# Patient Record
Sex: Female | Born: 1985
Health system: Southern US, Community
[De-identification: ages and names within clinical notes are randomized; demographics above are authoritative.]

## PROBLEM LIST (undated history)

## (undated) ENCOUNTER — Inpatient Hospital Stay (HOSPITAL_COMMUNITY): Payer: Self-pay

## (undated) DIAGNOSIS — O24419 Gestational diabetes mellitus in pregnancy, unspecified control: Secondary | ICD-10-CM

## (undated) DIAGNOSIS — F419 Anxiety disorder, unspecified: Secondary | ICD-10-CM

## (undated) DIAGNOSIS — Z789 Other specified health status: Secondary | ICD-10-CM

## (undated) HISTORY — DX: Anxiety disorder, unspecified: F41.9

## (undated) HISTORY — DX: Other specified health status: Z78.9

## (undated) HISTORY — PX: NO PAST SURGERIES: SHX2092

---

## 2007-06-30 ENCOUNTER — Emergency Department: Payer: Self-pay | Admitting: Emergency Medicine

## 2008-04-11 ENCOUNTER — Other Ambulatory Visit: Admission: RE | Admit: 2008-04-11 | Discharge: 2008-04-11 | Payer: Self-pay | Admitting: Obstetrics and Gynecology

## 2012-01-03 DIAGNOSIS — N946 Dysmenorrhea, unspecified: Secondary | ICD-10-CM | POA: Insufficient documentation

## 2012-01-24 DIAGNOSIS — R102 Pelvic and perineal pain: Secondary | ICD-10-CM | POA: Insufficient documentation

## 2015-11-24 DIAGNOSIS — Z6824 Body mass index (BMI) 24.0-24.9, adult: Secondary | ICD-10-CM | POA: Diagnosis not present

## 2015-11-24 DIAGNOSIS — Z01419 Encounter for gynecological examination (general) (routine) without abnormal findings: Secondary | ICD-10-CM | POA: Diagnosis not present

## 2015-12-24 DIAGNOSIS — L309 Dermatitis, unspecified: Secondary | ICD-10-CM | POA: Diagnosis not present

## 2015-12-24 DIAGNOSIS — L700 Acne vulgaris: Secondary | ICD-10-CM | POA: Diagnosis not present

## 2016-01-14 ENCOUNTER — Encounter: Payer: Self-pay | Admitting: Internal Medicine

## 2016-01-14 ENCOUNTER — Ambulatory Visit (INDEPENDENT_AMBULATORY_CARE_PROVIDER_SITE_OTHER): Payer: 59 | Admitting: Internal Medicine

## 2016-01-14 VITALS — BP 112/72 | HR 72 | Ht 63.0 in | Wt 134.0 lb

## 2016-01-14 DIAGNOSIS — Z8639 Personal history of other endocrine, nutritional and metabolic disease: Secondary | ICD-10-CM

## 2016-01-14 NOTE — Patient Instructions (Signed)
Please come for labs between 8-9 am, fasting, and plan to be here for 1h.  Stop all steroid products until then.  We will schedule a new appt if labs are abnormal.

## 2016-01-14 NOTE — Progress Notes (Signed)
Patient ID: Natalie Stewart, female   DOB: 08/03/1985, 30 y.o.   MRN: [id number]    HPI  Natalie Stewart is a 30 y.o.-year-old female, referred by her ObGyn Dr., Dr. Renaldo FiddlerAdkins, for evaluation for suspicion for adrenal insufficiency. DR. Kirke Corineborah Rotenstein (peds endo) [place], [place].  Pt. has been found to have "borderline" adrenal insufficiency after having bloodwork in her early 2320s. She was only was advised to take Prednisone if she got sick or under stress >> she never filled the Rx.   [place] [facility] [place], she was seen by Dr. Renaldo FiddlerAdkins, who recommended to get evaluated for AI before starting OCPs.   No h/o steroid use in the past. She is now on a Hydrocortisone cream for dry skin  - daily for 1 mo - on eyelids, upper cheek, elbow. No h/o Depo-provera, Megace, po ketoconazole, phenytoin, rifampin, chronic fluconazole use. + h/o autoimmune diseases in pt or family mbs.: PGM - DM1, M uncle - DM1. No excess use of NSAIDs. No h/o generalized infections or HIV. No IVDA. No h/o head injury or severe HA - has a h/o stress HA, or triggered by stress. No h/o malignancy.  Pt mentions: - + dizziness when stands - tries to stay hydrated - stable weight - no increased fatigue - + nausea, acid reflux - no vomiting - no abdominal pain - no mm aches - no palpitations - + syncopal episode x1 - with blood draw - no dark skin discoloration - + regular menses  No h/o hyponatremia or hyperkalemia.  ROS: Constitutional: See history of present illness Eyes: no blurry vision, no xerophthalmia ENT: no sore throat, no nodules palpated in throat, no dysphagia/odynophagia, no hoarseness Cardiovascular: no CP/SOB/palpitations/leg swelling Respiratory: no cough/SOB Gastrointestinal: + Occasional N/no V/D/C, + occasional acid reflux Musculoskeletal: no muscle/joint aches Skin: no rashes Neurological: no tremors/numbness/tingling/dizziness, + headaches Psychiatric: no  depression/anxiety  No past medical history except as mentioned above.  No past surgical history.  Social History   Social History  . Marital status: Single, Will get married next year     Spouse name: N/A  . Number of children: 0   Occupational History  . Cardiac surgery PA    Social History Main Topics  . Smoking status: Never Smoker  . Smokeless tobacco: Never Used  . Alcohol use Wine, twice a week, 1-2 drinks   . Drug use: No    She is not on prescription medications, other than hydrocortisone ointment. She is also on Tums and Advil.  No Known Allergies   She has a family history of type 1 diabetes in maternal uncle and paternal grandmother. Also, family history of HTN in father.  PE: Ht 5\' 3"  (1.6 m)   Wt 134 lb (60.8 kg)   LMP 12/31/2015   BMI 23.74 kg/m  Wt Readings from Last 3 Encounters:  01/14/16 134 lb (60.8 kg)   Constitutional: overweight, in NAD Eyes: PERRLA, EOMI, no exophthalmos ENT: moist mucous membranes, no thyromegaly, no cervical lymphadenopathy Cardiovascular: RRR, No MRG Respiratory: CTA B Gastrointestinal: abdomen soft, NT, ND, BS+ Musculoskeletal: no deformities, strength intact in all 4 Skin: moist, warm, no rashes; no dark discoloration of skin Neurological: no tremor with outstretched hands, DTR normal in all 4  ASSESSMENT: 1. History of mild adrenal insufficiency (AI)  PLAN:  1. History of mild adrenal insufficiency - The patient mentions that she has been tested in the past for adrenal insufficiency in the tests were mildly abnormal(Patient signed a  release of information request so we can obtain her records from her previous endocrinologist), so she was advised to only use steroids in case of illness or stress. She never filled the prescription. She denies possible symptoms of adrenal insufficiency: Weight loss, abdominal pain, back pain, skin darkening. She does have headaches, which are not new. Her menstrual cycles are regular.  She would like to start OCPs, but was told to recheck her adrenal status before starting these. - we discussed that the diagnosis test for AI is a cosyntropin stimulation test. I explained what this consists of. We will plan to check this at 8 am >> will return for this. I advised her not to use any steroid products around the time of the test >> will stop her hydrocortisone cream - if pt turns out to have AI (although my suspicion is low), we will need to check several tests to try to establish the etiology. - we also discussed about proper replacement with Hydrocortisone in case she has AI - a bid dose is likely best >> 10 mg in am and 5 mg in pm - We'll schedule another appointment if labs returned abnormal.  Component     Latest Ref Rng & Units 01/27/2016 01/27/2016 01/27/2016         8:13 AM  8:57 AM  9:28 AM  Cortisol, Plasma     ug/dL 16.110.3 09.617.8 04.521.5  [id number] ACTH     6 - 50 pg/mL <5 (L)     Cortisol levels are all normal, without any signs of adrenal insufficiency. ACTH has returned low, however, this is most likely due to improper handling (sample needs to be placed din ice immediately after collection). In the setting of of a concomitant normal cortisol level and with a normal stimulation test, the low ACTH level is of no consequence.  Carlus Pavlovristina Daphanie Oquendo, MD PhD [facility]

## 2016-01-21 ENCOUNTER — Other Ambulatory Visit: Payer: 59

## 2016-01-27 ENCOUNTER — Other Ambulatory Visit (INDEPENDENT_AMBULATORY_CARE_PROVIDER_SITE_OTHER): Payer: 59

## 2016-01-27 DIAGNOSIS — Z8639 Personal history of other endocrine, nutritional and metabolic disease: Secondary | ICD-10-CM | POA: Diagnosis not present

## 2016-01-27 DIAGNOSIS — E2740 Unspecified adrenocortical insufficiency: Secondary | ICD-10-CM | POA: Diagnosis not present

## 2016-01-27 LAB — CORTISOL
CORTISOL PLASMA: 21.5 ug/dL
Cortisol, Plasma: 10.3 ug/dL
Cortisol, Plasma: 17.8 ug/dL

## 2016-01-27 MED ORDER — COSYNTROPIN NICU IV SYRINGE 0.25 MG/ML (STANDARD DOSE)
0.2500 mg | Freq: Once | INTRAVENOUS | Status: AC
  Administered 2016-01-27: 0.25 mg via INTRAMUSCULAR

## 2016-02-01 LAB — ACTH

## 2016-04-29 MED FILL — LO LOESTRIN FE 1-10 TABLET: 1 MG-10 MCG | 28 days supply | Qty: 28 | Fill #0

## 2016-05-31 MED FILL — LO LOESTRIN FE 1-10 TABLET: 1 MG-10 MCG | 28 days supply | Qty: 28 | Fill #1

## 2016-06-29 MED FILL — LO LOESTRIN FE 1-10 TABLET: 1 MG-10 MCG | 84 days supply | Qty: 84 | Fill #2

## 2016-07-05 ENCOUNTER — Ambulatory Visit
Admission: RE | Admit: 2016-07-05 | Discharge: 2016-07-05 | Disposition: A | Discharge status: Home / Self Care | Payer: 59 | Source: Ambulatory Visit | Attending: Cardiothoracic Surgery | Admitting: Cardiothoracic Surgery

## 2016-07-05 ENCOUNTER — Other Ambulatory Visit: Payer: Self-pay | Admitting: *Deleted

## 2016-07-05 DIAGNOSIS — R0781 Pleurodynia: Secondary | ICD-10-CM | POA: Diagnosis not present

## 2016-07-05 DIAGNOSIS — S299XXA Unspecified injury of thorax, initial encounter: Secondary | ICD-10-CM | POA: Diagnosis not present

## 2016-07-06 MED FILL — DICLOFENAC SODIUM 1% GEL: 1 | 10 days supply | Qty: 100 | Fill #0

## 2016-07-28 DIAGNOSIS — H5213 Myopia, bilateral: Secondary | ICD-10-CM | POA: Diagnosis not present

## 2016-09-21 MED FILL — LO LOESTRIN FE 1-10 TABLET: 1 MG-10 MCG | 84 days supply | Qty: 84 | Fill #3

## 2016-12-05 DIAGNOSIS — Z6824 Body mass index (BMI) 24.0-24.9, adult: Secondary | ICD-10-CM | POA: Diagnosis not present

## 2016-12-05 DIAGNOSIS — Z01419 Encounter for gynecological examination (general) (routine) without abnormal findings: Secondary | ICD-10-CM | POA: Diagnosis not present

## 2016-12-14 MED FILL — LO LOESTRIN FE 1-10 TABLET: 1 MG-10 MCG | 84 days supply | Qty: 84 | Fill #4

## 2017-03-06 MED FILL — LO LOESTRIN FE 1-10 TABLET: 1 MG-10 MCG | 84 days supply | Qty: 84 | Fill #5

## 2017-06-01 MED FILL — LO LOESTRIN FE 1-10 TABLET: 1 MG-10 MCG | 84 days supply | Qty: 84 | Fill #0

## 2017-06-05 ENCOUNTER — Telehealth: Payer: Self-pay

## 2017-06-05 ENCOUNTER — Ambulatory Visit: Payer: 59 | Admitting: Family Medicine

## 2017-06-05 DIAGNOSIS — H5213 Myopia, bilateral: Secondary | ICD-10-CM | POA: Diagnosis not present

## 2017-06-05 NOTE — Telephone Encounter (Signed)
I called pt and stated that I have her scheduled for 4.15.19 @ 3pm and to call if this needs to be changed/thx dmf  Copied from CRM [id number]. Topic: Appointment Scheduling - Prior Auth Required for Appointment >> Jun 05, 2017  9:39 AM Jolayne Hainesaylor, Brittany L wrote: Pt's new pt/est care apt was cancelled today due to Dr Dayton MartesAron having to be out at the last min. Patient wants/needs to be seen sooner than the next available new patient appt which is in June. Please call patient to [place]. She prefers to be seen after 3 if all possible.  [phone]  >> Jun 05, 2017 11:01 AM Marcha SoldersWalker, Monica Kaydin Karbowski wrote: Per Marcelino DusterMichelle, she will contact patient.

## 2017-06-05 NOTE — Telephone Encounter (Signed)
Tried to call pt again/Friday I called and VM was full/today I was able to LMOVM/I advised that I needed to change her appt as Dr. Dayton MartesAron is not able to be here today and even if she doesn't get this message before she comes to her appt I would like to speak with her/I made the ladies at the front aware as I would like to make sure we get her in soon and if she is fasting we can figure out fasting labs for the OV/thx dmf

## 2017-06-19 ENCOUNTER — Ambulatory Visit (INDEPENDENT_AMBULATORY_CARE_PROVIDER_SITE_OTHER): Payer: 59 | Admitting: Family Medicine

## 2017-06-19 ENCOUNTER — Encounter: Payer: Self-pay | Admitting: Family Medicine

## 2017-06-19 DIAGNOSIS — K219 Gastro-esophageal reflux disease without esophagitis: Secondary | ICD-10-CM

## 2017-06-19 MED ORDER — RANITIDINE HCL 150 MG PO TABS: 150.0000 mg | ORAL_TABLET | Freq: Two times a day (BID) | ORAL | 3 refills | Status: DC

## 2017-06-19 MED FILL — raNITIdine HCL 150 MG TABS: 150 | 30 days supply | Qty: 60 | Fill #0

## 2017-06-19 NOTE — Patient Instructions (Addendum)
Great to meet you.   I will call you with your lab results from today and you can view them online.   We are starting ranitidine nightly or twice daily.  Please keep me updated.

## 2017-06-19 NOTE — Assessment & Plan Note (Signed)
Deteriorated. Start xantac. Check for H pylori today. Discussed GERD diet. Call or return to clinic prn if these symptoms worsen or fail to improve as anticipated. The patient indicates understanding of these issues and agrees with the plan.

## 2017-06-19 NOTE — Progress Notes (Signed)
Subjective:   Patient ID: Natalie Stewart, female    DOB: 01/20/1986, 32 y.o.   MRN: [id number]  Natalie Favreessa Kamiya is a pleasant 32 y.o. year old female who presents to clinic today with New Patient (Initial Visit) (Patient is here today to establish care.  She is not currently fasting.  She had her Tdap in August 2017 with work.  She sees Dr. Renaldo FiddlerAdkins for her Paps and was last completed in October results WNL.  ) and Gastroesophageal Reflux (Patient is here today to discuss reflux.  She tried Omeprazole & Zantac for 4 weeks then D/C'ed when became asymptomatic.  Realizes that she may needs to be on something longterm.)  on 06/19/2017  HPI:  She a PA for cardiovascular surgeons.  Intermittent symptoms of reflux since she was a teenager- mostly epigastric pain but can also have a feeling of acid coming up in her throat.  2 months ago, became acutely worse.  Had a severe episode one morning that lasted 8 hours.  Also had some gas.  She tried zantac and omeprazole for 4 weeks and then stopped both a couple of week ago as she was worried about taking anything long term.  Still having intermittent symptoms but not as severe.  Current Outpatient Medications on File Prior to Visit  Medication Sig Dispense Refill  . hydrocortisone cream 0.5 % Apply 1 application topically 2 (two) times daily as needed.     . LO LOESTRIN FE 1 MG-10 MCG / 10 MCG tablet Take 1 tablet by mouth daily.  4   No current facility-administered medications on file prior to visit.     No Known Allergies  No past medical history on file.   No family history on file.  Social History   Socioeconomic History  . Marital status: Single    Spouse name: Not on file  . Number of children: Not on file  . Years of education: Not on file  . Highest education level: Not on file  Occupational History  . Not on file  Social Needs  . Financial resource strain: Not on file  . Food insecurity:    Worry: Not on file    Inability: Not  on file  . Transportation needs:    Medical: Not on file    Non-medical: Not on file  Tobacco Use  . Smoking status: Never Smoker  . Smokeless tobacco: Never Used  Substance and Sexual Activity  . Alcohol use: Not on file  . Drug use: Not on file  . Sexual activity: Not on file  Lifestyle  . Physical activity:    Days per week: Not on file    Minutes per session: Not on file  . Stress: Not on file  Relationships  . Social connections:    Talks on phone: Not on file    Gets together: Not on file    Attends religious service: Not on file    Active member of club or organization: Not on file    Attends meetings of clubs or organizations: Not on file    Relationship status: Not on file  . Intimate partner violence:    Fear of current or ex partner: Not on file    Emotionally abused: Not on file    Physically abused: Not on file    Forced sexual activity: Not on file  Other Topics Concern  . Not on file  Social History Narrative  . Not on file   The PMH, PSH, Social  History, Family History, Medications, and allergies have been reviewed in [facility], and have been updated if relevant.   Review of Systems  Constitutional: Negative.   Gastrointestinal: Positive for abdominal pain. Negative for abdominal distention, anal bleeding, blood in stool, constipation, diarrhea, nausea, rectal pain and vomiting.  All other systems reviewed and are negative.      Objective:    BP 114/64 (BP Location: Left Arm, Patient Position: Sitting, Cuff Size: Normal)   Pulse 62   Temp 99.4 F (37.4 C) (Oral)   Ht 5' 2.5" (1.588 m)   Wt 132 lb (59.9 kg)   SpO2 98%   BMI 23.76 kg/m    Physical Exam   General:  Well-developed,well-nourished,in no acute distress; alert,appropriate and cooperative throughout examination Head:  normocephalic and atraumatic.   Eyes:  vision grossly intact, PERRL Ears:  R ear normal and L ear normal externally, TMs clear bilaterally Nose:  no external deformity.    Mouth:  good dentition.   Neck:  No deformities, masses, or tenderness noted. Lungs:  Normal respiratory effort, chest expands symmetrically. Lungs are clear to auscultation, no crackles or wheezes. Heart:  Normal rate and regular rhythm. S1 and S2 normal without gallop, murmur, click, rub or other extra sounds. Msk:  No deformity or scoliosis noted of thoracic or lumbar spine.   Extremities:  No clubbing, cyanosis, edema, or deformity noted with normal full range of motion of all joints.   Neurologic:  alert & oriented X3 and gait normal.   Skin:  Intact without suspicious lesions or rashes Psych:  Cognition and judgment appear intact. Alert and cooperative with normal attention span and concentration. No apparent delusions, illusions, hallucinations       Assessment & Plan:   Gastroesophageal reflux disease without esophagitis - Plan: H. pylori antibody, IgG No follow-ups on file.

## 2017-06-20 LAB — H. PYLORI ANTIBODY, IGG: H Pylori IgG: NEGATIVE

## 2017-06-21 ENCOUNTER — Telehealth: Payer: Self-pay

## 2017-06-21 DIAGNOSIS — K219 Gastro-esophageal reflux disease without esophagitis: Secondary | ICD-10-CM

## 2017-06-21 DIAGNOSIS — Z13220 Encounter for screening for lipoid disorders: Secondary | ICD-10-CM

## 2017-06-21 NOTE — Telephone Encounter (Signed)
-----   Message from Dianne Dunalia M Aron, MD sent at 06/21/2017 11:38 AM EDT ----- Yes please. Thank you.

## 2017-06-21 NOTE — Telephone Encounter (Signed)
Created future order per TA/thx dmf   Notes recorded by Dianne DunAron, Talia M, MD on 06/21/2017 at 11:38 AM EDT Yes please. Thank you. ------  Notes recorded by Lerry LinerFrederick, Arthi Mcdonald M, CMA on 06/21/2017 at 11:28 AM EDT TA-Would you like for me to future order CBC/Lipid/CMP/TSH/Vit-[facility] Patrone? Plz advise/thx dmf ------  Notes recorded by Dianne DunAron, Talia M, MD on 06/20/2017 at 5:12 PM EDT Yes we absolutely can check those. ------  Notes recorded by Lerry LinerFrederick, Rosaleen Mazer M, CMA on 06/20/2017 at 2:32 PM EDT Pt aware/she is doing well/she wants to know if she needs to get a baseline of cholesterol and all the other labs? Plz advise/thx dmf

## 2017-07-14 ENCOUNTER — Encounter: Payer: Self-pay | Admitting: Family Medicine

## 2017-08-20 DIAGNOSIS — F419 Anxiety disorder, unspecified: Secondary | ICD-10-CM | POA: Diagnosis not present

## 2017-08-21 MED FILL — raNITIdine HCL 150 MG TABS: 150 | 30 days supply | Qty: 60 | Fill #1

## 2017-10-20 MED FILL — raNITIdine HCL 150 MG TABS: 150 | 30 days supply | Qty: 60 | Fill #2

## 2017-12-13 MED FILL — LO LOESTRIN FE 1-10 TABLET: 1 MG-10 MCG | 28 days supply | Qty: 28 | Fill #0

## 2017-12-18 MED FILL — raNITIdine HCL 150 MG TABS: 150 | 30 days supply | Qty: 60 | Fill #3

## 2018-01-10 DIAGNOSIS — Z6824 Body mass index (BMI) 24.0-24.9, adult: Secondary | ICD-10-CM | POA: Diagnosis not present

## 2018-01-10 DIAGNOSIS — Z01419 Encounter for gynecological examination (general) (routine) without abnormal findings: Secondary | ICD-10-CM | POA: Diagnosis not present

## 2018-01-10 DIAGNOSIS — Z113 Encounter for screening for infections with a predominantly sexual mode of transmission: Secondary | ICD-10-CM | POA: Diagnosis not present

## 2018-02-21 MED FILL — LO LOESTRIN FE 1-10 TABLET: 1 MG-10 MCG | 84 days supply | Qty: 84 | Fill #0

## 2018-02-26 ENCOUNTER — Telehealth: Payer: Self-pay

## 2018-02-26 ENCOUNTER — Other Ambulatory Visit: Payer: Self-pay | Admitting: Family Medicine

## 2018-02-26 MED ORDER — FAMOTIDINE 20 MG PO TABS: 20.0000 mg | ORAL_TABLET | Freq: Two times a day (BID) | ORAL | 3 refills | Status: DC

## 2018-02-26 NOTE — Telephone Encounter (Signed)
eRx sent for pepcid 20 mg twice daily.

## 2018-02-26 NOTE — Telephone Encounter (Signed)
Famotidine too?? There was a recent FDA report stating the nizatidine may be recalled too due to NMDA levels, just like ranitidine. We could try cimetidine or she could buy pepcid OTC.  There are no safety concerns about pepcid.  It must be on back order because of the other H2 blockers being recalled.

## 2018-02-26 NOTE — Telephone Encounter (Signed)
TA-[facility] pharmacy sent a fax stating that Ranitidine and Famotidine are on back order/The only other H2 Blockers I see are Nizatidine and Cimetidine? Plz advise/thx dmf

## 2018-02-26 NOTE — Telephone Encounter (Signed)
TA-The Zantac is on back order and pharmacy is asking for a replacement? Plz advise/thx dmf

## 2018-05-18 MED FILL — LO LOESTRIN FE 1-10 TABLET: 1 MG-10 MCG | 84 days supply | Qty: 84 | Fill #1

## 2018-08-13 MED FILL — LO LOESTRIN FE 1-10 TABLET: 1 MG-10 MCG | 84 days supply | Qty: 84 | Fill #2

## 2018-08-25 NOTE — Progress Notes (Signed)
Subjective:   Patient ID: Natalie Stewart, female    DOB: 07/19/1985, 33 y.o.   MRN: [id number]  Natalie Stewart is a pleasant 33 y.o. year old female who presents to clinic today with Annual Exam (Pt screened at vehicle. She is here today for a CPE.  She is not due for a PAP till 10.16.2021 and sees Dr. Zelphia CairoGretchen Adkins.  Immunizations are UTD.)  on 08/27/2018  HPI:  Doing well.  Back at work.  Still walking a lot.  Has GYN- Dr. Renaldo FiddlerAdkins.  Not due for a pap until 12/2019. On Lo loestrin for OCP Current Outpatient Medications on File Prior to Visit  Medication Sig Dispense Refill  . LO LOESTRIN FE 1 MG-10 MCG / 10 MCG tablet Take 1 tablet by mouth daily.  4   No current facility-administered medications on file prior to visit.     No Known Allergies  No past medical history on file.  No family history on file.  Social History   Socioeconomic History  . Marital status: Single    Spouse name: Not on file  . Number of children: Not on file  . Years of education: Not on file  . Highest education level: Not on file  Occupational History  . Not on file  Social Needs  . Financial resource strain: Not on file  . Food insecurity    Worry: Not on file    Inability: Not on file  . Transportation needs    Medical: Not on file    Non-medical: Not on file  Tobacco Use  . Smoking status: Never Smoker  . Smokeless tobacco: Never Used  Substance and Sexual Activity  . Alcohol use: Not on file  . Drug use: Not on file  . Sexual activity: Not on file  Lifestyle  . Physical activity    Days per week: Not on file    Minutes per session: Not on file  . Stress: Not on file  Relationships  . Social Musicianconnections    Talks on phone: Not on file    Gets together: Not on file    Attends religious service: Not on file    Active member of club or organization: Not on file    Attends meetings of clubs or organizations: Not on file    Relationship status: Not on file  . Intimate partner violence    Fear of current or ex partner: Not on file    Emotionally abused: Not on file    Physically abused: Not on file    Forced sexual activity: Not on file  Other Topics Concern  . Not on file  Social History Narrative  . Not on file   The PMH, PSH, Social History, Family History, Medications, and allergies [facility], and have been updated if relevant.   Review of Systems  Constitutional: Negative.   HENT: Negative.   Eyes: Negative.   Respiratory: Negative.   Endocrine: Negative.   Genitourinary: Negative.   Allergic/Immunologic: Positive for environmental allergies.  Neurological: Negative.        Objective:    BP 110/60 (BP Location: Left Arm, Patient Position: Sitting, Cuff Size: Normal)   Pulse (!) 53   Temp 98.9 F (37.2 C) (Oral)   Ht 5' 2.75" (1.594 m)   Wt 134 lb (60.8 kg)   SpO2 99%   BMI 23.93 kg/m    Physical Exam   General:  Well-developed,well-nourished,in no acute distress; alert,appropriate and cooperative throughout examination  Head:  normocephalic and atraumatic.   Eyes:  vision grossly intact, PERRL Ears:  R ear normal and L ear normal externally, TMs clear bilaterally Nose:  no external deformity.   Mouth:  good dentition.   Neck:  No deformities, masses, or tenderness noted. Lungs:  Normal respiratory effort, chest expands symmetrically. Lungs are clear to auscultation, no crackles or wheezes. Heart:  Normal rate and regular rhythm. S1 and S2 normal without gallop, murmur, click, rub or other extra sounds. Abdomen:  Bowel sounds positive,abdomen soft and non-tender without masses, organomegaly or hernias noted. Msk:  No deformity or scoliosis noted of thoracic or lumbar spine.   Extremities:  No clubbing, cyanosis, edema, or deformity noted with normal full range of motion of all joints.   Neurologic:  alert & oriented X3 and gait normal.   Skin:  Intact without suspicious lesions or rashes Cervical Nodes:  No lymphadenopathy  noted Axillary Nodes:  No palpable lymphadenopathy Psych:  Cognition and judgment appear intact. Alert and cooperative with normal attention span and concentration. No apparent delusions, illusions, hallucinations       Assessment & Plan:   Encounter for well woman exam without gynecological exam - Plan: Comprehensive metabolic panel, Hemoglobin A1c, Lipid panel, CANCELED: Lipid panel, CANCELED: Comprehensive metabolic panel, CANCELED: Hemoglobin A1c,  No follow-ups on file.

## 2018-08-27 ENCOUNTER — Other Ambulatory Visit: Payer: Self-pay

## 2018-08-27 ENCOUNTER — Ambulatory Visit (INDEPENDENT_AMBULATORY_CARE_PROVIDER_SITE_OTHER): Payer: 59 | Admitting: Family Medicine

## 2018-08-27 ENCOUNTER — Encounter: Payer: Self-pay | Admitting: Family Medicine

## 2018-08-27 VITALS — BP 110/60 | HR 53 | Temp 98.9°F | Ht 62.75 in | Wt 134.0 lb

## 2018-08-27 DIAGNOSIS — Z0000 Encounter for general adult medical examination without abnormal findings: Secondary | ICD-10-CM | POA: Insufficient documentation

## 2018-08-27 NOTE — Patient Instructions (Signed)
Great to see you. I will call you with your lab results from today and you can view them online.   

## 2018-08-28 ENCOUNTER — Encounter: Payer: Self-pay | Admitting: Family Medicine

## 2018-08-28 LAB — LIPID PANEL
Cholesterol: 153 mg/dL (ref <200)
HDL: 53 mg/dL (ref >=50)
LDL Cholesterol (Calc): 80 mg/dL (calc)
Non-HDL Cholesterol (Calc): 100 mg/dL (calc) (ref <130)
Total CHOL/HDL Ratio: 2.9 (calc) (ref <5.0)
Triglycerides: 107 mg/dL (ref <150)

## 2018-08-28 LAB — COMPREHENSIVE METABOLIC PANEL
AG Ratio: 2.1 (calc) (ref 1.0–2.5)
ALT: 16 U/L (ref 6–29)
AST: 18 U/L (ref 10–30)
Albumin: 4.7 g/dL (ref 3.6–5.1)
Alkaline phosphatase (APISO): 50 U/L (ref 31–125)
BUN/Creatinine Ratio: 17 (calc) (ref 6–22)
BUN: 20 mg/dL (ref 7–25)
CO2: 27 mmol/L (ref 20–32)
Calcium: 9.5 mg/dL (ref 8.6–10.2)
Chloride: 106 mmol/L (ref 98–110)
Creat: 1.15 mg/dL — ABNORMAL HIGH (ref 0.50–1.10)
Globulin: 2.2 g/dL (calc) (ref 1.9–3.7)
Glucose, Bld: 99 mg/dL (ref 65–99)
Potassium: 5.4 mmol/L — ABNORMAL HIGH (ref 3.5–5.3)
Sodium: 141 mmol/L (ref 135–146)
Total Bilirubin: 0.5 mg/dL (ref 0.2–1.2)
Total Protein: 6.9 g/dL (ref 6.1–8.1)

## 2018-08-28 LAB — HEMOGLOBIN A1C
Hgb A1c MFr Bld: 4.8 % of total Hgb (ref <5.7)
Mean Plasma Glucose: 91 (calc)
eAG (mmol/L): 5 (calc)

## 2018-11-13 MED FILL — LO LOESTRIN FE 1-10 TABLET: 1 MG-10 MCG | 84 days supply | Qty: 84 | Fill #3

## 2018-12-05 DIAGNOSIS — H16102 Unspecified superficial keratitis, left eye: Secondary | ICD-10-CM | POA: Diagnosis not present

## 2019-02-04 DIAGNOSIS — Z01419 Encounter for gynecological examination (general) (routine) without abnormal findings: Secondary | ICD-10-CM | POA: Diagnosis not present

## 2019-02-04 DIAGNOSIS — Z6823 Body mass index (BMI) 23.0-23.9, adult: Secondary | ICD-10-CM | POA: Diagnosis not present

## 2019-02-04 DIAGNOSIS — Z3040 Encounter for surveillance of contraceptives, unspecified: Secondary | ICD-10-CM | POA: Diagnosis not present

## 2019-02-04 DIAGNOSIS — Z3041 Encounter for surveillance of contraceptive pills: Secondary | ICD-10-CM | POA: Diagnosis not present

## 2019-02-04 DIAGNOSIS — N760 Acute vaginitis: Secondary | ICD-10-CM | POA: Diagnosis not present

## 2019-02-04 MED FILL — LO LOESTRIN FE 1-10 TABLET: 1 MG-10 MCG | 84 days supply | Qty: 84 | Fill #0

## 2019-03-05 NOTE — Progress Notes (Signed)
  Self Swab Type: Anterior Nasal

## 2019-03-28 ENCOUNTER — Telehealth: Payer: Self-pay | Admitting: Family Medicine

## 2019-03-28 NOTE — Telephone Encounter (Signed)
Tried to call pt about message sent in: Appointment Request From: Jari Favre    With Provider: [name], MD [[facility]]    Preferred Date Range: Any    Preferred Times: Tuesday Afternoon, Wednesday Afternoon, Thursday Afternoon    Reason for visit: Office Visit    Comments:  Chest pain at night    Lvm, need more detail, Because of the comment I did talk to [facility] about it as well and I was going to try and get more info. Im going to route to Dr Dayton Martes as well just in case

## 2019-03-29 NOTE — Telephone Encounter (Signed)
Please call to check on patient. 

## 2019-03-29 NOTE — Telephone Encounter (Signed)
TA-No recurrent chest pain/has VV scheduled/thx dmf

## 2019-04-01 NOTE — Progress Notes (Signed)
Virtual Visit via Video   Due to the COVID-19 pandemic, this visit was completed with telemedicine (audio/video) technology to reduce patient and provider exposure as well as to preserve personal protective equipment.   I connected with Natalie Stewart by a video enabled telemedicine application and verified that I am speaking with the correct person using two identifiers. Location patient: Home Location provider: [facility], Office Persons participating in the virtual visit: Natalie Stewart, [name], MD   I discussed the limitations of evaluation and management by telemedicine and the availability of in person appointments. The patient expressed understanding and agreed to proceed.  Care Team   Patient Care Team: Dianne Dun, MD as PCP - General (Family Medicine)  Subjective:   HPI: Pt is connecting to discuss chest wall pain that started after she was working out on Wednesday. The pain presented that night and next morning only.  Review of Systems  Constitutional: Negative.  Negative for fever and malaise/fatigue.  HENT: Negative.  Negative for congestion and hearing loss.   Eyes: Negative.  Negative for blurred vision, discharge and redness.  Respiratory: Negative for cough and shortness of breath.   Cardiovascular: Positive for chest pain. Negative for palpitations, orthopnea, claudication, leg swelling and PND.  Gastrointestinal: Negative.  Negative for abdominal pain and heartburn.  Genitourinary: Negative for dysuria.  Musculoskeletal: Positive for myalgias. Negative for back pain, falls, joint pain and neck pain.  Skin: Negative.  Negative for rash.  Neurological: Negative for loss of consciousness and headaches.  Endo/Heme/Allergies: Does not bruise/bleed easily.  Psychiatric/Behavioral: Negative for depression.  All other systems reviewed and are negative.    Patient Active Problem List   Diagnosis Date Noted  . Encounter for well woman exam without gynecological  exam 08/27/2018  . GERD (gastroesophageal reflux disease) 06/19/2017    Social History   Tobacco Use  . Smoking status: Never Smoker  . Smokeless tobacco: Never Used  Substance Use Topics  . Alcohol use: Not on file    Current Outpatient Medications:  .  LO LOESTRIN FE 1 MG-10 MCG / 10 MCG tablet, Take 1 tablet by mouth daily., Disp: , Rfl: 4  No Known Allergies  Objective:  There were no vitals taken for this visit.  VITALS: Per patient if applicable, see vitals. GENERAL: Alert, appears well and in no acute distress. HEENT: Atraumatic, conjunctiva clear, no obvious abnormalities on inspection of external nose and ears. NECK: Normal movements of the head and neck. CARDIOPULMONARY: No increased WOB. Speaking in clear sentences. I:E ratio WNL.  MS: Moves all visible extremities without noticeable abnormality. PSYCH: Pleasant and cooperative, well-groomed. Speech normal rate and rhythm. Affect is appropriate. Insight and judgement are appropriate. Attention is focused, linear, and appropriate.  NEURO: CN grossly intact. Oriented as arrived to appointment on time with no prompting. Moves both UE equally.  SKIN: No obvious lesions, wounds, erythema, or cyanosis noted on face or hands.  Depression screen [facility] 2/9 08/27/2018 06/19/2017  Decreased Interest 0 0  Down, Depressed, Hopeless 0 0  PHQ - 2 Score 0 0     . COVID-19 Education: The signs and symptoms of COVID-19 were discussed with the patient and how to seek care for testing if needed. The importance of social distancing was discussed today. . Reviewed expectations re: course of current medical issues. . Discussed self-management of symptoms. . Outlined signs and symptoms indicating need for more acute intervention. . Patient verbalized understanding and all questions were answered. Marland Kitchen Health Maintenance  issues including appropriate healthy diet, exercise, and smoking avoidance were discussed with patient. . See orders for this  visit as documented in the electronic medical record.  Arnette Norris, MD  Records requested if needed. Time spent: 25  minutes, of which >50% was spent in obtaining information about her symptoms, reviewing her previous labs, evaluations, and treatments, counseling her about her condition (please see the discussed topics above), and developing a plan to further investigate it; she had a number of questions which I addressed.   Lab Results  Component Value Date   GLUCOSE 99 08/27/2018   CHOL 153 08/27/2018   TRIG 107 08/27/2018   HDL 53 08/27/2018   LDLCALC 80 08/27/2018   ALT 16 08/27/2018   AST 18 08/27/2018   NA 141 08/27/2018   K 5.4 (H) 08/27/2018   CL 106 08/27/2018   CREATININE 1.15 (H) 08/27/2018   BUN 20 08/27/2018   CO2 27 08/27/2018   HGBA1C 4.8 08/27/2018    No results found for: TSH No results found for: WBC, HGB, HCT, MCV, PLT Lab Results  Component Value Date   NA 141 08/27/2018   K 5.4 (H) 08/27/2018   CO2 27 08/27/2018   GLUCOSE 99 08/27/2018   BUN 20 08/27/2018   CREATININE 1.15 (H) 08/27/2018   BILITOT 0.5 08/27/2018   AST 18 08/27/2018   ALT 16 08/27/2018   PROT 6.9 08/27/2018   CALCIUM 9.5 08/27/2018   Lab Results  Component Value Date   CHOL 153 08/27/2018   Lab Results  Component Value Date   HDL 53 08/27/2018   Lab Results  Component Value Date   LDLCALC 80 08/27/2018   Lab Results  Component Value Date   TRIG 107 08/27/2018   Lab Results  Component Value Date   CHOLHDL 2.9 08/27/2018   Lab Results  Component Value Date   HGBA1C 4.8 08/27/2018       Assessment & Plan:   Problem List Items Addressed This Visit    None      I am having Nicholes Rough maintain her Lo Loestrin Fe.  No orders of the defined types were placed in this encounter.    Arnette Norris, MD

## 2019-04-02 ENCOUNTER — Telehealth (INDEPENDENT_AMBULATORY_CARE_PROVIDER_SITE_OTHER): Payer: 59 | Admitting: Family Medicine

## 2019-04-02 DIAGNOSIS — R079 Chest pain, unspecified: Secondary | ICD-10-CM | POA: Insufficient documentation

## 2019-04-02 NOTE — Assessment & Plan Note (Signed)
Did burpies on Wednesday early in the day.  Was awoken from sleep with sharp left sided pain 10 or so hours later.  Took a deep breath and she heard a bubble pop- once she heard the pop, felt instant relief.  The following day muscle in that area was sore.  Four days later, ran 5 miles without any CP, SOB, LE or dizziness.  Reassuring history and physical.  Not concerning for cardiac source or air embolism.  Exercising without difficulty.  Most likely a sternal pop due to muscle strain.   She is deferring further work up at this time which is reasonable.  Call or send my chart message prn if these symptoms worsen or fail to improve as anticipated.  The patient indicates understanding of these issues and agrees with the plan.

## 2019-05-01 MED FILL — LO LOESTRIN FE 1-10 TABLET: 1 MG-10 MCG | 84 days supply | Qty: 84 | Fill #1

## 2019-07-25 MED FILL — LO LOESTRIN FE 1-10 TABLET: 1 MG-10 MCG | 84 days supply | Qty: 84 | Fill #2

## 2019-10-18 MED FILL — LO LOESTRIN FE 1-10 TABLET: 1 MG-10 MCG | 84 days supply | Qty: 84 | Fill #3

## 2020-01-13 MED FILL — LO LOESTRIN FE 1-10 TABLET: 1 MG-10 MCG | 84 days supply | Qty: 84 | Fill #4

## 2020-01-20 ENCOUNTER — Encounter (HOSPITAL_COMMUNITY): Payer: Self-pay | Admitting: Anesthesiology

## 2020-01-20 ENCOUNTER — Emergency Department (HOSPITAL_COMMUNITY)
Admission: EM | Admit: 2020-01-20 | Discharge: 2020-01-20 | Disposition: A | Discharge status: Home / Self Care | Payer: PRIVATE HEALTH INSURANCE | Attending: Emergency Medicine | Admitting: Emergency Medicine

## 2020-01-20 DIAGNOSIS — R110 Nausea: Secondary | ICD-10-CM | POA: Diagnosis not present

## 2020-01-20 DIAGNOSIS — R001 Bradycardia, unspecified: Secondary | ICD-10-CM | POA: Insufficient documentation

## 2020-01-20 DIAGNOSIS — R55 Syncope and collapse: Secondary | ICD-10-CM | POA: Diagnosis not present

## 2020-01-20 DIAGNOSIS — I959 Hypotension, unspecified: Secondary | ICD-10-CM | POA: Insufficient documentation

## 2020-01-20 LAB — CBC WITH DIFFERENTIAL/PLATELET
Abs Immature Granulocytes: 0.05 10*3/uL (ref 0.00–0.07)
Basophils Absolute: 0 10*3/uL (ref 0.0–0.1)
Basophils Relative: 0 %
Eosinophils Absolute: 0.1 10*3/uL (ref 0.0–0.5)
Eosinophils Relative: 1 %
HCT: 36.4 % (ref 36.0–46.0)
Hemoglobin: 11.9 g/dL — ABNORMAL LOW (ref 12.0–15.0)
Immature Granulocytes: 1 %
Lymphocytes Relative: 17 %
Lymphs Abs: 1.5 10*3/uL (ref 0.7–4.0)
MCH: 30.5 pg (ref 26.0–34.0)
MCHC: 32.7 g/dL (ref 30.0–36.0)
MCV: 93.3 fL (ref 80.0–100.0)
Monocytes Absolute: 0.5 10*3/uL (ref 0.1–1.0)
Monocytes Relative: 6 %
Neutro Abs: 6.7 10*3/uL (ref 1.7–7.7)
Neutrophils Relative %: 75 %
Platelets: 189 10*3/uL (ref 150–400)
RBC: 3.9 MIL/uL (ref 3.87–5.11)
RDW: 12.5 % (ref 11.5–15.5)
WBC: 8.9 10*3/uL (ref 4.0–10.5)
nRBC: 0 % (ref 0.0–0.2)

## 2020-01-20 LAB — BASIC METABOLIC PANEL
Anion gap: 13 (ref 5–15)
BUN: 17 mg/dL (ref 6–20)
CO2: 19 mmol/L — ABNORMAL LOW (ref 22–32)
Calcium: 8.3 mg/dL — ABNORMAL LOW (ref 8.9–10.3)
Chloride: 106 mmol/L (ref 98–111)
Creatinine, Ser: 0.95 mg/dL (ref 0.44–1.00)
GFR, Estimated: >60 mL/min (ref >60)
Glucose, Bld: 98 mg/dL (ref 70–99)
Potassium: 4 mmol/L (ref 3.5–5.1)
Sodium: 138 mmol/L (ref 135–145)

## 2020-01-20 LAB — I-STAT BETA HCG BLOOD, ED (MC, WL, AP ONLY): I-stat hCG, quantitative: <5 m[IU]/mL (ref <5)

## 2020-01-20 NOTE — ED Notes (Signed)
DC instructions reviewed with pt. Pt verbalized understanding.  Pt DC 

## 2020-01-20 NOTE — Discharge Instructions (Signed)
Return to ER for any worsening or concerning symptoms. Recheck with your doctor as needed.

## 2020-01-20 NOTE — Progress Notes (Signed)
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  Patient was helped to the floor without trauma to her head, and then transferred to stretcher in the PACU for further care.  She states she ate breakfast early this morning, with no PO intake following.  Patient denies any other medical history.  On arrival to PACU, patient VS obtained and showed bradycardia to 40s and hypotension.  A PIV was obtained by a CRNA, IV fluids begun, and 4mg  Zofran given IV for complaint of nausea.  Hypotension improved with IV fluid administration, nausea resolved, and HR returned to patient's baseline in 50s.  Patient initially declined transfer to ED, but after discussion with hospital administration and [facility]), patient agreed to ED transfer for further care and evaluation.  Patient transferred to ED by PACU staff and report given to bedside nurse.  [facility], MD Anesthesiology

## 2020-01-20 NOTE — ED Provider Notes (Signed)
[facility] EMERGENCY DEPARTMENT Provider Note   CSN: [id number] Arrival date & time: 01/20/20  1351     History Chief Complaint  Patient presents with  . Loss of Consciousness    Natalie Stewart is a 34 y.o. female.  34 year old female presents for evaluation after syncopal episode. Patient was in the OR at the hospital today when she started to feel weak, nauseous, dizzy. Patient was told that she passed out but did not fall, was assisted to the ground. Patient was initially hypotensive and bradycardic, CBG normal, given 1 LNS and monitored for about an hour before she was send down to the ER to be evaluated/cleared to return to work. Patient reports history of vasovagal syncope- once while donating blood and a second time 43 years age when she was in school. Patient is on OCP, does not suspect she is pregnant. Reports working out 2-3 times/week before work, had her breakfast and normal workout this morning prior to coming to work. Patient feels well at this time, no complaints.         No past medical history on file.  Patient Active Problem List   Diagnosis Date Noted  . Chest pain at rest 04/02/2019  . Encounter for well woman exam without gynecological exam 08/27/2018  . GERD (gastroesophageal reflux disease) 06/19/2017    OB History   No obstetric history on file.     No family history on file.  Social History   Tobacco Use  . Smoking status: Never Smoker  . Smokeless tobacco: Never Used  Substance Use Topics  . Alcohol use: Not on file  . Drug use: Not on file    Home Medications Prior to Admission medications   Medication Sig Start Date End Date Taking? Authorizing Provider  LO LOESTRIN FE 1 MG-10 MCG / 10 MCG tablet Take 1 tablet by mouth daily. 06/01/17   [provider]  Multiple Vitamins-Minerals (MULTIVITAMIN WITH MINERALS) tablet Take 1 tablet by mouth daily.    [provider]    Allergies    Patient has no known  allergies.  Review of Systems   Review of Systems  Constitutional: Negative for chills and fever.  Respiratory: Negative for shortness of breath.   Cardiovascular: Negative for chest pain.  Gastrointestinal: Positive for nausea. Negative for abdominal pain.  Musculoskeletal: Negative for arthralgias and myalgias.  Skin: Negative for wound.  Allergic/Immunologic: Negative for immunocompromised state.  Neurological: Positive for syncope, weakness and light-headedness.  Psychiatric/Behavioral: Negative for confusion.  All other systems reviewed and are negative.   Physical Exam Updated Vital Signs BP 115/74   Pulse 64   Resp 17   SpO2 100%   Physical Exam Vitals and nursing note reviewed.  Constitutional:      General: She is not in acute distress.    Appearance: She is well-developed. She is not diaphoretic.  HENT:     Head: Normocephalic and atraumatic.     Mouth/Throat:     Mouth: Mucous membranes are moist.  Eyes:     Pupils: Pupils are equal, round, and reactive to light.  Cardiovascular:     Rate and Rhythm: Normal rate and regular rhythm.     Pulses: Normal pulses.     Heart sounds: Normal heart sounds.  Pulmonary:     Effort: Pulmonary effort is normal.     Breath sounds: Normal breath sounds.  Abdominal:     Palpations: Abdomen is soft.     Tenderness:  There is no abdominal tenderness.  Musculoskeletal:     Cervical back: Neck supple.  Skin:    General: Skin is warm and dry.     Findings: No erythema or rash.  Neurological:     Mental Status: She is alert and oriented to person, place, and time.     Sensory: No sensory deficit.     Motor: No weakness.  Psychiatric:        Behavior: Behavior normal.     ED Results / Procedures / Treatments   Labs (all labs ordered are listed, but only abnormal results are displayed) Labs Reviewed  BASIC METABOLIC PANEL - Abnormal; Notable for the following components:      Result Value   CO2 19 (*)    Calcium 8.3  (*)    All other components within normal limits  CBC WITH DIFFERENTIAL/PLATELET - Abnormal; Notable for the following components:   Hemoglobin 11.9 (*)    All other components within normal limits  I-STAT BETA HCG BLOOD, ED ([facility], [facility], [facility] ONLY)    EKG EKG Interpretation  Date/Time:  Monday January 20 2020 15:00:58 EST Ventricular Rate:  60 PR Interval:    QRS Duration: 101 QT Interval:  422 QTC Calculation: 422 R Axis:   62 Text Interpretation: Sinus rhythm Low voltage, precordial leads Confirmed by Benjiman Core [id number]) on 01/20/2020 3:04:42 PM   Radiology No results found.  Procedures Procedures (including critical care time)  Medications Ordered in ED Medications - No data to display  ED Course  I have reviewed the triage vital signs and the nursing notes.  Pertinent labs & imaging results that were available during my care of the patient were reviewed by me and considered in my medical decision making (see chart for details).  Clinical Course as of Jan 19 1625  Mon Jan 20, 2020  5742 34 year old female with report of syncope today while in the ER, reports feeling unwell prior to event, no injury, was assisted to the ground. Patient was evaluated and given IV fluids in the PACU, presents to the ER for clearance.  Patient is well appearing on exam, vitals reassuring, not orthostatic. Labs including CBC and BMP without significant changes, hcg negative. Advised to return to the ER for any concerning symptoms, otherwise recheck with PCP.    [LM]    Clinical Course User Index [LM] Alden Hipp   MDM Rules/Calculators/A&P                          Final Clinical Impression(s) / ED Diagnoses Final diagnoses:  Syncope, unspecified syncope type    Rx / DC Orders ED Discharge Orders    None       Jeannie Fend, PA-C 01/20/20 1626    Benjiman Core, MD 01/21/20 0700

## 2020-01-20 NOTE — ED Triage Notes (Signed)
Pt arrived from [facility] OR where she had a syncopal event while working Pt states a few minutes prior to passing out she started to feel nauseous and had blurry vision.   Staff with pt were able to lower her to the ground, start an IV and give 1L NS.  Pt now alert and oriented, states that she is feeling better, c/o fatigue

## 2020-01-21 LAB — CBG MONITORING, ED: Glucose-Capillary: 101 mg/dL — ABNORMAL HIGH (ref 70–99)

## 2020-01-28 ENCOUNTER — Encounter: Payer: Self-pay | Admitting: Medical-Surgical

## 2020-01-28 ENCOUNTER — Ambulatory Visit (INDEPENDENT_AMBULATORY_CARE_PROVIDER_SITE_OTHER): Payer: 59 | Admitting: Medical-Surgical

## 2020-01-28 ENCOUNTER — Other Ambulatory Visit: Payer: Self-pay | Admitting: Medical-Surgical

## 2020-01-28 VITALS — BP 115/72 | HR 61 | Temp 98.7°F | Ht 62.25 in | Wt 132.1 lb

## 2020-01-28 DIAGNOSIS — Z1159 Encounter for screening for other viral diseases: Secondary | ICD-10-CM | POA: Diagnosis not present

## 2020-01-28 DIAGNOSIS — R55 Syncope and collapse: Secondary | ICD-10-CM

## 2020-01-28 DIAGNOSIS — F418 Other specified anxiety disorders: Secondary | ICD-10-CM

## 2020-01-28 DIAGNOSIS — Z7689 Persons encountering health services in other specified circumstances: Secondary | ICD-10-CM | POA: Diagnosis not present

## 2020-01-28 DIAGNOSIS — Z3041 Encounter for surveillance of contraceptive pills: Secondary | ICD-10-CM | POA: Diagnosis not present

## 2020-01-28 MED ORDER — LO LOESTRIN FE 1 MG-10 MCG / 10 MCG PO TABS: 1.0000 | ORAL_TABLET | Freq: Every day | ORAL | 1 refills | Status: DC

## 2020-01-28 NOTE — Patient Instructions (Addendum)
Consider [employer] for counseling  [email or web address]

## 2020-01-28 NOTE — Progress Notes (Signed)
New Patient Office Visit  Subjective:  Patient ID: Natalie Stewart, female    DOB: 06-21-1985  Age: 34 y.o. MRN: [id number]  CC:  Chief Complaint  Patient presents with  . Establish Care    HPI Natalie Stewart presents to establish care. 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  Scrubbed in at work last week, experienced what was thought to be a vasovagal episode. Was evaluated in the ED and given fluids. No abnormal found. Unfortunately, now is having fear of a repeat episode. Has scrubbed in a few times since her episode and done well but she is also limiting her time that she stays scrubbed in.    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 Wake up in the middle of the night with a sharp pain in her chest that was like a sternal chest wall pain. Only lasts a couple of minutes. Usually happens after a workout or in times of high anxiety (holidays, etc.). Feels the frequency is less than it used to be. Symptoms are manageable overall without intervention.  Past Medical History:  Diagnosis Date  . No pertinent past medical history     Past Surgical History:  Procedure Laterality Date  . NO PAST SURGERIES      Family History  Problem Relation Age of Onset  . Gallbladder disease Mother   . Breast cancer Maternal Grandmother   . Bladder Cancer Paternal Grandmother   . Dementia Paternal Grandmother   . Arthritis Paternal Grandmother   . Heart failure Paternal Grandfather   . Atrial fibrillation Paternal Grandfather    Social History   Socioeconomic History  . Marital status: Single    Spouse name: Not on file  . Number of children: Not on file  . Years of education: Not on file  . Highest education level: Not on file  Occupational History  . Not on file  Tobacco Use  . Smoking status: Never Smoker  . Smokeless tobacco: Never Used  Vaping Use  . Vaping Use: Never used  Substance and Sexual Activity  . Alcohol use: Yes    Alcohol/week: 1.0 standard drink    Types:  1 Standard drinks or equivalent per week  . Drug use: Never  . Sexual activity: Yes    Partners: Male    Birth control/protection: Pill  Other Topics Concern  . Not on file  Social History Narrative  . Not on file   Social Determinants of Health   Financial Resource Strain:   . Difficulty of Paying Living Expenses: Not on file  Food Insecurity:   . Worried About Programme researcher, broadcasting/film/video in the Last Year: Not on file  . Ran Out of Food in the Last Year: Not on file  Transportation Needs:   . Lack of Transportation (Medical): Not on file  . Lack of Transportation (Non-Medical): Not on file  Physical Activity:   . Days of Exercise per Week: Not on file  . Minutes of Exercise per Session: Not on file  Stress:   . Feeling of Stress : Not on file  Social Connections:   . Frequency of Communication with Friends and Family: Not on file  . Frequency of Social Gatherings with Friends and Family: Not on file  . Attends Religious Services: Not on file  . Active Member of Clubs or Organizations: Not on file  . Attends Banker Meetings: Not on file  . Marital Status: Not on file  Intimate Partner Violence:   .  Fear of Current or Ex-Partner: Not on file  . Emotionally Abused: Not on file  . Physically Abused: Not on file  . Sexually Abused: Not on file    ROS Review of Systems  Constitutional: Negative for chills, fatigue, fever and unexpected weight change.  Respiratory: Negative for cough, shortness of breath and wheezing.   Cardiovascular: Positive for chest pain (see HPI). Negative for palpitations and leg swelling.  Neurological: Positive for syncope (see HPI) and headaches (intermittent, mild, responds to Tylenol). Negative for dizziness and light-headedness.  Psychiatric/Behavioral: Negative for dysphoric mood and sleep disturbance. The patient is nervous/anxious (situational).    Objective:   Today's Vitals: BP 115/72   Pulse 61   Temp 98.7 F (37.1 C) (Oral)    Ht 5' 2.25" (1.581 m)   Wt 132 lb 1.6 oz (59.9 kg)   SpO2 99%   BMI 23.97 kg/m   Physical Exam Vitals reviewed.  Constitutional:      General: She is not in acute distress.    Appearance: Normal appearance.  HENT:     Head: Normocephalic and atraumatic.  Cardiovascular:     Rate and Rhythm: Normal rate and regular rhythm.     Pulses: Normal pulses.     Heart sounds: Normal heart sounds. No murmur heard.  No friction rub. No gallop.   Pulmonary:     Effort: Pulmonary effort is normal. No respiratory distress.     Breath sounds: Normal breath sounds. No wheezing.  Skin:    General: Skin is warm and dry.  Neurological:     Mental Status: She is alert and oriented to person, place, and time.  Psychiatric:        Mood and Affect: Mood normal.        Behavior: Behavior normal.        Thought Content: Thought content normal.        Judgment: Judgment normal.    Assessment & Plan:   1. Encounter to establish care Reviewed available information and dicussed health care concerns with patient. Recntly had a full panel of blood work done at her ED visit. Up to date on preventative care.   2. Vasovagal syncope Recommend working to stay well hydrated and try to avoid long periods without food. Discussed measures that might be helpful to get her back to her normal job performance.  3. Situational anxiety Anxiety revolves around the possibility of a second vasovagal syncope episode while in the OR. Unfortunately, medication is not going to be possible due to the setting. Recommend looking into counseling option. Discussed [employer]'s partnership with [employer] to provide 24/7 access to counselors for free.   4. Surveillance for birth control, oral contraceptives She will be followed for birth control by her OB/GYN but can't get in to see her before the first of the year. Birth control due for refill, recent negative pregnancy test. Refilled for 3 months to allow for her appointment  with OB/GYN.   5. Need for hepatitis C screening test Discussed screening recommendations. She is agreeable so we will order today for collection with next blood work.  - Hepatitis C antibody  Outpatient Encounter Medications as of 01/28/2020  Medication Sig  . LO LOESTRIN FE 1 MG-10 MCG / 10 MCG tablet Take 1 tablet by mouth daily.  . Multiple Vitamins-Minerals (MULTIVITAMIN WITH MINERALS) tablet Take 1 tablet by mouth daily.  . [DISCONTINUED] LO LOESTRIN FE 1 MG-10 MCG / 10 MCG tablet Take 1 tablet by mouth  daily.   No facility-administered encounter medications on file as of 01/28/2020.   Follow-up: Return if symptoms worsen or fail to improve.   Thayer Ohm, DNP, APRN, FNP-BC [facility]

## 2020-02-06 DIAGNOSIS — Z20822 Contact with and (suspected) exposure to covid-19: Secondary | ICD-10-CM | POA: Diagnosis not present

## 2020-03-21 DIAGNOSIS — Z20822 Contact with and (suspected) exposure to covid-19: Secondary | ICD-10-CM | POA: Diagnosis not present

## 2020-04-21 MED FILL — LO LOESTRIN FE 1-10 TABLET: 1 MG-10 MCG | 84 days supply | Qty: 84 | Fill #0

## 2020-06-09 DIAGNOSIS — Z6823 Body mass index (BMI) 23.0-23.9, adult: Secondary | ICD-10-CM | POA: Diagnosis not present

## 2020-06-09 DIAGNOSIS — Z01419 Encounter for gynecological examination (general) (routine) without abnormal findings: Secondary | ICD-10-CM | POA: Diagnosis not present

## 2020-08-25 ENCOUNTER — Encounter: Payer: Self-pay | Admitting: Medical-Surgical

## 2020-08-25 DIAGNOSIS — R000 Tachycardia, unspecified: Secondary | ICD-10-CM

## 2020-08-25 DIAGNOSIS — H5213 Myopia, bilateral: Secondary | ICD-10-CM | POA: Diagnosis not present

## 2020-09-23 ENCOUNTER — Emergency Department (HOSPITAL_BASED_OUTPATIENT_CLINIC_OR_DEPARTMENT_OTHER): Payer: 59 | Admitting: Radiology

## 2020-09-23 ENCOUNTER — Encounter (HOSPITAL_BASED_OUTPATIENT_CLINIC_OR_DEPARTMENT_OTHER): Payer: Self-pay | Admitting: *Deleted

## 2020-09-23 ENCOUNTER — Other Ambulatory Visit: Payer: Self-pay

## 2020-09-23 ENCOUNTER — Emergency Department (HOSPITAL_BASED_OUTPATIENT_CLINIC_OR_DEPARTMENT_OTHER)
Admission: EM | Admit: 2020-09-23 | Discharge: 2020-09-23 | Disposition: A | Discharge status: Home / Self Care | Payer: 59 | Attending: Emergency Medicine | Admitting: Emergency Medicine

## 2020-09-23 ENCOUNTER — Emergency Department (HOSPITAL_BASED_OUTPATIENT_CLINIC_OR_DEPARTMENT_OTHER): Payer: 59

## 2020-09-23 DIAGNOSIS — R0602 Shortness of breath: Secondary | ICD-10-CM | POA: Diagnosis not present

## 2020-09-23 DIAGNOSIS — R0789 Other chest pain: Secondary | ICD-10-CM | POA: Diagnosis not present

## 2020-09-23 DIAGNOSIS — R079 Chest pain, unspecified: Secondary | ICD-10-CM | POA: Diagnosis not present

## 2020-09-23 DIAGNOSIS — O26891 Other specified pregnancy related conditions, first trimester: Secondary | ICD-10-CM | POA: Diagnosis not present

## 2020-09-23 DIAGNOSIS — O99891 Other specified diseases and conditions complicating pregnancy: Secondary | ICD-10-CM | POA: Diagnosis not present

## 2020-09-23 DIAGNOSIS — Z3A01 Less than 8 weeks gestation of pregnancy: Secondary | ICD-10-CM | POA: Diagnosis not present

## 2020-09-23 DIAGNOSIS — Z3A00 Weeks of gestation of pregnancy not specified: Secondary | ICD-10-CM | POA: Insufficient documentation

## 2020-09-23 DIAGNOSIS — I82403 Acute embolism and thrombosis of unspecified deep veins of lower extremity, bilateral: Secondary | ICD-10-CM | POA: Diagnosis not present

## 2020-09-23 LAB — COMPREHENSIVE METABOLIC PANEL
ALT: 14 U/L (ref 0–44)
AST: 19 U/L (ref 15–41)
Albumin: 4.7 g/dL (ref 3.5–5.0)
Alkaline Phosphatase: 57 U/L (ref 38–126)
Anion gap: 10 (ref 5–15)
BUN: 12 mg/dL (ref 6–20)
CO2: 24 mmol/L (ref 22–32)
Calcium: 8.8 mg/dL — ABNORMAL LOW (ref 8.9–10.3)
Chloride: 103 mmol/L (ref 98–111)
Creatinine, Ser: 0.82 mg/dL (ref 0.44–1.00)
GFR, Estimated: >60 mL/min (ref >60)
Glucose, Bld: 103 mg/dL — ABNORMAL HIGH (ref 70–99)
Potassium: 3.8 mmol/L (ref 3.5–5.1)
Sodium: 137 mmol/L (ref 135–145)
Total Bilirubin: 0.8 mg/dL (ref 0.3–1.2)
Total Protein: 7.3 g/dL (ref 6.5–8.1)

## 2020-09-23 LAB — CBC WITH DIFFERENTIAL/PLATELET
Abs Immature Granulocytes: 0.03 10*3/uL (ref 0.00–0.07)
Basophils Absolute: 0 10*3/uL (ref 0.0–0.1)
Basophils Relative: 0 %
Eosinophils Absolute: 0.1 10*3/uL (ref 0.0–0.5)
Eosinophils Relative: 1 %
HCT: 38.2 % (ref 36.0–46.0)
Hemoglobin: 13.4 g/dL (ref 12.0–15.0)
Immature Granulocytes: 0 %
Lymphocytes Relative: 19 %
Lymphs Abs: 1.7 10*3/uL (ref 0.7–4.0)
MCH: 30.8 pg (ref 26.0–34.0)
MCHC: 35.1 g/dL (ref 30.0–36.0)
MCV: 87.8 fL (ref 80.0–100.0)
Monocytes Absolute: 0.5 10*3/uL (ref 0.1–1.0)
Monocytes Relative: 6 %
Neutro Abs: 6.8 10*3/uL (ref 1.7–7.7)
Neutrophils Relative %: 74 %
Platelets: 219 10*3/uL (ref 150–400)
RBC: 4.35 MIL/uL (ref 3.87–5.11)
RDW: 12.8 % (ref 11.5–15.5)
WBC: 9.2 10*3/uL (ref 4.0–10.5)
nRBC: 0 % (ref 0.0–0.2)

## 2020-09-23 LAB — BRAIN NATRIURETIC PEPTIDE: B Natriuretic Peptide: 12.3 pg/mL (ref 0.0–100.0)

## 2020-09-23 LAB — TROPONIN I (HIGH SENSITIVITY): Troponin I (High Sensitivity): <2 ng/L (ref <18)

## 2020-09-23 LAB — D-DIMER, QUANTITATIVE: D-Dimer, Quant: 0.35 ug/mL-FEU (ref 0.00–0.50)

## 2020-09-23 NOTE — ED Provider Notes (Signed)
[facility] EMERGENCY DEPT Provider Note   CSN: [id number] Arrival date & time: 09/23/20  1214     History Chief Complaint  Patient presents with   Chest Pain    Natalie Stewart is a 35 y.o. female.  Natalie Stewart is [redacted] weeks pregnant.  She presents with left-sided chest pain radiating to her left neck and even into her back at times.  It has been intermittent for several days, and it seems to be worse with exertion today.  No history of pulmonary embolism or DVT.  No family history of heart disease.  She has follow-up with cardiology tomorrow  The history is provided by the patient.  Chest Pain Pain location:  L chest Pain quality: pressure and sharp   Pain radiates to:  Neck Pain severity:  Moderate Onset quality:  Sudden Duration:  3 days Timing:  Intermittent Progression:  Waxing and waning Chronicity:  New Context comment:  No known cause Relieved by:  Nothing Worsened by:  Exertion Ineffective treatments:  Rest Associated symptoms: shortness of breath   Associated symptoms: no abdominal pain, no back pain, no cough, no diaphoresis, no fever, no lower extremity edema, no nausea, no numbness, no palpitations, no syncope, no vomiting and no weakness       Past Medical History:  Diagnosis Date   No pertinent past medical history     Patient Active Problem List   Diagnosis Date Noted   Vasovagal syncope 01/28/2020   Chest pain at rest 04/02/2019   Encounter for well woman exam without gynecological exam 08/27/2018   GERD (gastroesophageal reflux disease) 06/19/2017    Past Surgical History:  Procedure Laterality Date   NO PAST SURGERIES       OB History   No obstetric history on file.     Family History  Problem Relation Age of Onset   Gallbladder disease Mother    Breast cancer Maternal Grandmother    Bladder Cancer Paternal Grandmother    Dementia Paternal Grandmother    Arthritis Paternal Grandmother    Heart failure Paternal Grandfather     Atrial fibrillation Paternal Grandfather     Social History   Tobacco Use   Smoking status: Never   Smokeless tobacco: Never  Vaping Use   Vaping Use: Never used  Substance Use Topics   Alcohol use: Yes    Alcohol/week: 1.0 standard drink    Types: 1 Standard drinks or equivalent per week   Drug use: Never    Home Medications Prior to Admission medications   Medication Sig Start Date End Date Taking? Authorizing Provider  LO LOESTRIN FE 1 MG-10 MCG / 10 MCG tablet TAKE 1 TABLET BY MOUTH DAILY. 01/28/20 01/27/21  Christen Butter, NP  Multiple Vitamins-Minerals (MULTIVITAMIN WITH MINERALS) tablet Take 1 tablet by mouth daily.    [provider]    Allergies    Patient has no allergy information on record.  Review of Systems   Review of Systems  Constitutional:  Negative for chills, diaphoresis and fever.  HENT:  Negative for ear pain and sore throat.   Eyes:  Negative for pain and visual disturbance.  Respiratory:  Positive for shortness of breath. Negative for cough.   Cardiovascular:  Positive for chest pain. Negative for palpitations and syncope.  Gastrointestinal:  Negative for abdominal pain, nausea and vomiting.  Genitourinary:  Negative for dysuria and hematuria.  Musculoskeletal:  Negative for arthralgias and back pain.  Skin:  Negative for color change and rash.  Neurological:  Negative for seizures, syncope, weakness and numbness.  All other systems reviewed and are negative.  Physical Exam Updated Vital Signs BP 112/76 (BP Location: Left Arm)   Pulse 65   Temp 98.3 F (36.8 C) (Oral)   Resp 18   Ht 5\' 3"  (1.6 m)   Wt 59 kg   SpO2 100%   BMI 23.03 kg/m   Physical Exam Vitals and nursing note reviewed.  Constitutional:      Appearance: She is well-developed.  HENT:     Head: Normocephalic and atraumatic.  Cardiovascular:     Rate and Rhythm: Normal rate and regular rhythm.     Heart sounds: Normal heart sounds.  Pulmonary:     Effort:  Pulmonary effort is normal. No tachypnea.     Breath sounds: Normal breath sounds.  Musculoskeletal:     Right lower leg: No edema.     Left lower leg: No edema.  Skin:    General: Skin is warm and dry.  Neurological:     General: No focal deficit present.     Mental Status: She is alert and oriented to person, place, and time.  Psychiatric:        Mood and Affect: Mood normal.        Behavior: Behavior normal.    ED Results / Procedures / Treatments   Labs (all labs ordered are listed, but only abnormal results are displayed) Labs Reviewed  COMPREHENSIVE METABOLIC PANEL - Abnormal; Notable for the following components:      Result Value   Glucose, Bld 103 (*)    Calcium 8.8 (*)    All other components within normal limits  CBC WITH DIFFERENTIAL/PLATELET  D-DIMER, QUANTITATIVE  BRAIN NATRIURETIC PEPTIDE  TROPONIN I (HIGH SENSITIVITY)  TROPONIN I (HIGH SENSITIVITY)    EKG EKG Interpretation  Date/Time:  Wednesday September 23 2020 12:26:15 EDT Ventricular Rate:  86 PR Interval:  124 QRS Duration: 88 QT Interval:  366 QTC Calculation: 437 R Axis:   79 Text Interpretation: Normal sinus rhythm Possible Anterior infarct , age undetermined Abnormal ECG normal axis No acute ischemia Confirmed by [id number] ([id number]) on 09/23/2020 4:21:48 PM  Radiology DG Chest 2 View  Result Date: 09/23/2020 CLINICAL DATA:  Left chest pain EXAM: CHEST - 2 VIEW COMPARISON:  07/05/2016 FINDINGS: The heart size and mediastinal contours are within normal limits. Both lungs are clear. The visualized skeletal structures are unremarkable. IMPRESSION: No active cardiopulmonary disease. Electronically Signed   By: 09/04/2016 M.D.   On: 09/23/2020 17:59   09/25/2020 Venous Img Lower Bilateral  Result Date: 09/23/2020 CLINICAL DATA:  Deep venous thrombosis. Chest pain for 2 days. Five weeks pregnant. EXAM: BILATERAL LOWER EXTREMITY VENOUS DOPPLER ULTRASOUND TECHNIQUE: Gray-scale sonography with compression, as  well as color and duplex ultrasound, were performed to evaluate the deep venous system(s) from the level of the common femoral vein through the popliteal and proximal calf veins. COMPARISON:  None. FINDINGS: RIGHTLOWER EXTREMITY VENOUSS Normal compressibility of the common femoral, superficial femoral, and popliteal veins, as well as the visualized calf veins. Visualized portions of profunda femoral vein and great saphenous vein unremarkable. No filling defects to suggest DVT on grayscale or color Doppler imaging. Doppler waveforms show normal direction of venous flow, normal respiratory plasticity and response to augmentation. LEFT LOWER EXTREMITY VENOUS Normal compressibility of the common femoral, superficial femoral, and popliteal veins, as well as the visualized calf veins. Visualized portions of profunda femoral vein and great saphenous  vein unremarkable. No filling defects to suggest DVT on grayscale or color Doppler imaging. Doppler waveforms show normal direction of venous flow, normal respiratory plasticity and response to augmentation. OTHER None. Limitations: none IMPRESSION: No deep venous thrombosis of bilateral lower extremities. Electronically Signed   By: Tish Frederickson M.D.   On: 09/23/2020 17:39    Procedures Procedures   Medications Ordered in ED Medications - No data to display  ED Course  I have reviewed the triage vital signs and the nursing notes.  Pertinent labs & imaging results that were available during my care of the patient were reviewed by me and considered in my medical decision making (see chart for details).    MDM Rules/Calculators/A&P                           HEART score: 2  Natalie Stewart presents with chest pain.  She is [redacted] weeks pregnant.  She was evaluated for evidence of ACS, PE.  No evidence of pneumonia.  I did order DVT scans initially because I was anticipating a possibly elevated D-dimer level due to her pregnancy.  Fortunately, her D-dimer was within  normal limits which excludes PE from the differential.  No evidence of congestive heart failure/cardiomyopathy.  She will be discharged home with recommendations for close follow-up, and she has cardiology follow-up tomorrow.  Pregnancy is uncomplicated, and she denies abdominal pain.  Imaging was not performed for her pregnancy. Final Clinical Impression(s) / ED Diagnoses Final diagnoses:  Chest pain, unspecified type  [redacted] weeks gestation of pregnancy    Rx / DC Orders ED Discharge Orders     None        Koleen Distance, MD 09/23/20 Rickey Primus

## 2020-09-23 NOTE — ED Triage Notes (Signed)
Presents with left ant chest pain, radiates to left of neck, radiation to back occurred last PM, While at work became even more short of breath, states she felt like her HR would be fast than slow. First trimester , approx [redacted] weeks pregnant.

## 2020-09-24 ENCOUNTER — Ambulatory Visit (INDEPENDENT_AMBULATORY_CARE_PROVIDER_SITE_OTHER): Payer: 59

## 2020-09-24 ENCOUNTER — Encounter: Payer: Self-pay | Admitting: Internal Medicine

## 2020-09-24 ENCOUNTER — Ambulatory Visit (INDEPENDENT_AMBULATORY_CARE_PROVIDER_SITE_OTHER): Payer: 59 | Admitting: Internal Medicine

## 2020-09-24 VITALS — BP 104/72 | HR 66 | Ht 63.0 in | Wt 130.0 lb

## 2020-09-24 DIAGNOSIS — R079 Chest pain, unspecified: Secondary | ICD-10-CM

## 2020-09-24 DIAGNOSIS — R55 Syncope and collapse: Secondary | ICD-10-CM | POA: Diagnosis not present

## 2020-09-24 DIAGNOSIS — R002 Palpitations: Secondary | ICD-10-CM | POA: Insufficient documentation

## 2020-09-24 DIAGNOSIS — R0602 Shortness of breath: Secondary | ICD-10-CM

## 2020-09-24 NOTE — Progress Notes (Signed)
New Outpatient Visit Date: 09/24/2020  Referring Provider: Christen Butter, NP [address] [place],  [place] [address]  Chief Complaint: Palpitations and chest pain  HPI:  Natalie Stewart is a 34 y.o. female who is being seen today for the evaluation of chest pain and tachycardia at the request of Natalie Stewart. She has no significant past medical history.  Beginning in March, Natalie Stewart began to notice episodic elevated heart rates, sometimes going up to 140 bpm, without clear precipitant.  She would monitor her heart rate on her smart watch, indicating that it appeared to be sinus tachycardia.  Episodes initially happened every few weeks but have become more frequent.  In retrospect, she notes discontinuation of OCP shortly before symptoms began.  She notes associated lightheadedness and shortness of breath.  There were no obvious precipitants for her palpitations; she thought that elevated heart rates may be related to stress from her upcoming wedding.  Three days ago, Natalie Stewart began to experience burning and squeezing in her left upper chest radiating to the back.  She notes some tenderness in this area.  Pain also seem to improve when she would lean forward and was worsened with deep inspiration.  She discovered that she was pregnant that day and thought maybe that could be contributing to her discomfort.  She stopped drinking caffeinated coffee, in case that precipitated her chest pain.  She also ice the area.  The pain would come and go throughout the day but became more persistent yesterday.  It was associated with palpitations, lightheadedness, dizziness, and vision changes.  A coworker checked her blood pressure and noted her systolic pressure to be 130 mmHg.  She was also tachycardic though an exact pulse rate was not measured.  She proceeded to the [facility] for further evaluation.  Work-up was unrevealing, including normal EKG and negative at HS-TnI x 1.  BNP and D-dimer  were also negative.  The symptoms persisted through most of her ED visit but were no longer present this morning.  Natalie Stewart notes sharp chest pains associated with heavy lifting about a year ago, which resolved after she stopped doing [employer].  She has experienced occasional syncopal episodes with blood draws.  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  She reports feeling dizzy and clammy for a few moments and then recalls waking up on the floor with anesthesia at her side.  ED work-up was unremarkable.  --------------------------------------------------------------------------------------------------  Cardiovascular History & Procedures: Cardiovascular Problems: Chest pain Palpitations Syncope  Risk Factors: None  Cath/PCI: None  CV Surgery: None  EP Procedures and Devices: None  Non-Invasive Evaluation(s): None  Recent CV Pertinent Labs: Lab Results  Component Value Date   CHOL 153 08/27/2018   HDL 53 08/27/2018   LDLCALC 80 08/27/2018   TRIG 107 08/27/2018   CHOLHDL 2.9 08/27/2018   BNP 12.3 09/23/2020   K 3.8 09/23/2020   BUN 12 09/23/2020   CREATININE 0.82 09/23/2020   CREATININE 1.15 (H) 08/27/2018    --------------------------------------------------------------------------------------------------  Past Medical History:  Diagnosis Date   No pertinent past medical history     Past Surgical History:  Procedure Laterality Date   NO PAST SURGERIES      Current Meds  Medication Sig   Prenatal Vit-Fe Fumarate-FA (PRENATAL MULTIVITAMIN) TABS tablet Take 1 tablet by mouth daily at 12 noon.    Allergies: Patient has no allergy information on record.  Social History  Tobacco Use   Smoking status: Never   Smokeless tobacco: Never  Vaping Use   Vaping Use: Never used  Substance Use Topics   Alcohol use: Not Currently    Alcohol/week: 1.0 standard drink    Types: 1 Standard drinks or equivalent per week    Comment: 1  per week   Drug use: Never    Family History  Problem Relation Age of Onset   Hypertension Mother    Gallbladder disease Mother    Breast cancer Maternal Grandmother    Bladder Cancer Paternal Grandmother    Dementia Paternal Grandmother    Heart failure Paternal Grandfather    Atrial fibrillation Paternal Grandfather     Review of Systems: A 12-system review of systems was performed and was negative except as noted in the HPI.  --------------------------------------------------------------------------------------------------  Physical Exam: BP 104/72 (BP Location: Right Arm, Patient Position: Sitting, Cuff Size: Normal)   Pulse 66   Ht 5\' 3"  (1.6 m)   Wt 130 lb (59 kg)   SpO2 98%   BMI 23.03 kg/m   General: NAD.  Accompanied by her husband. HEENT: No conjunctival pallor or scleral icterus. Facemask in place. Neck: Supple without lymphadenopathy, thyromegaly, JVD, or HJR. No carotid bruit. Lungs: Normal work of breathing. Clear to auscultation bilaterally without wheezes or crackles. Heart: Regular rate and rhythm without murmurs, rubs, or gallops. Non-displaced PMI. Abd: Bowel sounds present. Soft, NT/ND without hepatosplenomegaly Ext: No lower extremity edema. Radial, PT, and DP pulses are 2+ bilaterally Skin: Warm and dry without rash. Neuro: CNIII-XII intact. Strength and fine-touch sensation intact in upper and lower extremities bilaterally. Psych: Normal mood and affect.  EKG: Normal sinus rhythm without significant abnormality.  Lab Results  Component Value Date   WBC 9.2 09/23/2020   HGB 13.4 09/23/2020   HCT 38.2 09/23/2020   MCV 87.8 09/23/2020   PLT 219 09/23/2020    Lab Results  Component Value Date   NA 137 09/23/2020   K 3.8 09/23/2020   CL 103 09/23/2020   CO2 24 09/23/2020   BUN 12 09/23/2020   CREATININE 0.82 09/23/2020   GLUCOSE 103 (H) 09/23/2020   ALT 14 09/23/2020    Lab Results  Component Value Date   CHOL 153 08/27/2018   HDL 53  08/27/2018   LDLCALC 80 08/27/2018   TRIG 107 08/27/2018   CHOLHDL 2.9 08/27/2018   --------------------------------------------------------------------------------------------------  ASSESSMENT AND PLAN: Chest pain, shortness of breath, palpitations, and syncope: Multiple symptoms beginning with palpitations have been present since March but have increased since then, culminating in chest pain with associated palpitations, shortness of breath, and lightheadedness for the last 2 to 3 days.  ED visit yesterday was unrevealing.  Currently, Natalie Stewart is asymptomatic.  Physical exam is unremarkable.  EKG is also normal.  Notably, she is about [redacted] weeks pregnant.  This is her first pregnancy.  Given waxing and waning nature of chest pain, normal EKG, and negative troponin yesterday, I low suspicion for ACS, including SCAD.  Pleuritic nature of the pain raises potential for pericarditis, though EKG and physical exam were not consistent with this.  PE unlikely in the setting of negative D-dimer yesterday.  I favor this being musculoskeletal/GI in nature.  I have recommended that we obtain a transthoracic echocardiogram and 14-day event monitor for further evaluation.  Her syncopal episodes in the past are most consistent with vasovagal mechanism.  I encouraged her to stay well-hydrated.  I will attempt to add a TSH  onto labs that were drawn yesterday in the ED.  If this is not possible, TSH should be checked when Natalie Stewart presents for her echo.  I advised her to seek immediate medical attention for worsening symptoms.  Follow-up: Return to clinic in 6 months.  Yvonne Kendall, MD 09/24/2020 2:25 PM

## 2020-09-24 NOTE — Patient Instructions (Signed)
Medication Instructions:   Your physician recommends that you continue on your current medications as directed. Please refer to the Current Medication list given to you today.  *If you need a refill on your cardiac medications before your next appointment, please call your pharmacy*   Lab Work:  None ordered  Testing/Procedures:  1) Your physician has requested that you have an echocardiogram to be done at our [facility] office in 2-3 weeks. Echocardiography is a painless test that uses sound waves to create images of your heart. It provides your doctor with information about the size and shape of your heart and how well your heart's chambers and valves are working. This procedure takes approximately one hour. There are no restrictions for this procedure.  2) Your physician has recommended that you wear a Zio XT monitor for TWO WEEKS.   This monitor is a medical device that records the heart's electrical activity. Doctors most often use these monitors to diagnose arrhythmias. Arrhythmias are problems with the speed or rhythm of the heartbeat. The monitor is a small device applied to your chest. You can wear one while you do your normal daily activities. While wearing this monitor if you have any symptoms to push the button and record what you felt. Once you have worn this monitor for the period of time provider prescribed (Usually 14 days), you will return the monitor device in the postage paid box. Once it is returned they will download the data collected and provide [place] with a report which the provider will then review and we will call you with those results. Important tips:  Avoid showering during the first 24 hours of wearing the monitor. Avoid excessive sweating to help maximize wear time. Do not submerge the device, no hot tubs, and no swimming pools. Keep any lotions or oils away from the patch. After 24 hours you may shower with the patch on. Take brief showers with your back facing  the shower head.  Do not remove patch once it has been placed because that will interrupt data and decrease adhesive wear time. Push the button when you have any symptoms and write down what you were feeling. Once you have completed wearing your monitor, remove and place into box which has postage paid and place in your outgoing mailbox.  If for some reason you have misplaced your box then call our office and we can provide another box and/or mail it off for you.     Follow-Up: At [facility], you and your health needs are our priority.  As part of our continuing mission to provide you with exceptional heart care, we have created designated Provider Care Teams.  These Care Teams include your primary Cardiologist (physician) and Advanced Practice Providers (APPs -  Physician Assistants and Nurse Practitioners) who all work together to provide you with the care you need, when you need it.  We recommend signing up for the patient portal called "MyChart".  Sign up information is provided on this After Visit Summary.  MyChart is used to connect with patients for Virtual Visits (Telemedicine).  Patients are able to view lab/test results, encounter notes, upcoming appointments, etc.  Non-urgent messages can be sent to your provider as well.   To learn more about what you can do with MyChart, go to [email or web address].    Your next appointment:   4 - 6 week(s)  The format for your next appointment:   In Person  Provider:   You may see Dr. Cristal Deer  End or one of the following Advanced Practice Providers on your designated Care Team:   Nicolasa Ducking, NP Eula Listen, PA-C Marisue Ivan, PA-C Cadence [place], [place]

## 2020-09-25 NOTE — Addendum Note (Signed)
Addended byChristen Butter on: 09/25/2020 02:28 PM   Modules accepted: Orders

## 2020-09-28 DIAGNOSIS — R000 Tachycardia, unspecified: Secondary | ICD-10-CM | POA: Diagnosis not present

## 2020-09-29 ENCOUNTER — Telehealth (INDEPENDENT_AMBULATORY_CARE_PROVIDER_SITE_OTHER): Payer: 59 | Admitting: Medical-Surgical

## 2020-09-29 ENCOUNTER — Encounter: Payer: Self-pay | Admitting: Medical-Surgical

## 2020-09-29 ENCOUNTER — Other Ambulatory Visit (HOSPITAL_COMMUNITY): Payer: Self-pay

## 2020-09-29 DIAGNOSIS — R002 Palpitations: Secondary | ICD-10-CM

## 2020-09-29 DIAGNOSIS — F418 Other specified anxiety disorders: Secondary | ICD-10-CM | POA: Diagnosis not present

## 2020-09-29 LAB — TSH: TSH: 1.59 u[IU]/mL (ref 0.450–4.500)

## 2020-09-29 MED ORDER — SERTRALINE HCL 25 MG PO TABS
ORAL_TABLET | ORAL | 3 refills | Status: DC
  Filled 2020-09-29: qty 30, 18d supply, fill #0
  Filled 2020-10-30: qty 30, 12d supply, fill #1
  Filled 2020-12-01: qty 30, 30d supply, fill #2

## 2020-09-29 NOTE — Progress Notes (Signed)
Recent cardio workup, palpitations Anxiety Pregnant, 5 weeks

## 2020-09-29 NOTE — Progress Notes (Signed)
Virtual Visit via Video Note  I connected with Natalie Stewart on 09/29/20 at  3:00 PM EDT by a video enabled telemedicine application and verified that I am speaking with the correct person using two identifiers.   I discussed the limitations of evaluation and management by telemedicine and the availability of in person appointments. The patient expressed understanding and agreed to proceed.  Patient location: home Provider locations: office  Subjective:    CC: Anxiety, palpitations  HPI: Pleasant 35 year old female presenting today for discussion of anxiety and palpitations that she has been experiencing recently.  She was able to get in with cardiology and is undergoing a full work-up which has been normal thus far.  She does have quite a bit of anxiety related to her health status.  After her syncopal event last year, she has had consistent worry about having another event, especially at work.  She also worries all the time that something bad may happen.  She is now [redacted] weeks pregnant which is added another layer to her concerns.  She has had another event that was near syncope and accompanied by shortness of breath, dizziness, and palpitations.  She also had a burning chest pain with this episode which has been very concerning for her.  She has had 2 stop her regular activities and notes that this has been somewhat troublesome.  She was previously an avid runner and very active but now she is limited to walking while they are doing her work-up.  Admits that sometimes she feels down and hopeless which is unusual for her.  She is interested in treatment for anxiety to see if this will at least curb some of her symptoms.  Denies SI/HI.  Past medical history, Surgical history, Family history not pertinant except as noted below, Social history, Allergies, and medications have been entered into the medical record, reviewed, and corrections made.   Review of Systems: See HPI for pertinent positives and  negatives.   Depression screen [facility] 2/9 09/29/2020 01/28/2020 08/27/2018 06/19/2017  Decreased Interest 2 0 0 0  Down, Depressed, Hopeless 2 0 0 0  PHQ - 2 Score 4 0 0 0  Altered sleeping 3 - - -  Tired, decreased energy 1 - - -  Change in appetite 2 - - -  Feeling bad or failure about yourself  1 - - -  Trouble concentrating 0 - - -  Moving slowly or fidgety/restless 0 - - -  Suicidal thoughts 0 - - -  PHQ-9 Score 11 - - -  Difficult doing work/chores Somewhat difficult - - -   GAD 7 : Generalized Anxiety Score 09/29/2020  Nervous, Anxious, on Edge 3  Control/stop worrying 2  Worry too much - different things 1  Trouble relaxing 1  Restless 0  Easily annoyed or irritable 1  Afraid - awful might happen 3  Total GAD 7 Score 11  Anxiety Difficulty Somewhat difficult   Objective:    General: Speaking clearly in complete sentences without any shortness of breath.  Alert and oriented x3.  Normal judgment. No apparent acute distress.  Impression and Recommendations:    1. Situational anxiety 2. Palpitations Discussed various options for treatment of situational anxiety and reactive depression.  Suspect that her anxiety does have a bit to do with her frequent palpitations.  After review of options, she would like to have a referral to behavioral health to start counseling with someone who is in her network.  We also discussed different medications  and use of these agents during pregnancy.  The most recommended and well studied option is Zoloft.  She is agreeable to start this so we are starting sertraline 25 mg daily x1 week then increase to 50 mg daily.  Advised patient should she prefer to stay at 25 mg daily, this will be fine but we will touch base in about 3 to 4 weeks to see how she is doing.  I discussed the assessment and treatment plan with the patient. The patient was provided an opportunity to ask questions and all were answered. The patient agreed with the plan and demonstrated  an understanding of the instructions.   The patient was advised to call back or seek an in-person evaluation if the symptoms worsen or if the condition fails to improve as anticipated.  20 minutes of non-face-to-face time was provided during this encounter.  No follow-ups on file.  Thayer Ohm, DNP, APRN, FNP-BC [facility] and [facility] Medicine

## 2020-10-01 ENCOUNTER — Encounter: Payer: Self-pay | Admitting: Medical-Surgical

## 2020-10-05 ENCOUNTER — Encounter: Payer: Self-pay | Admitting: Medical-Surgical

## 2020-10-05 ENCOUNTER — Telehealth (INDEPENDENT_AMBULATORY_CARE_PROVIDER_SITE_OTHER): Payer: 59 | Admitting: Medical-Surgical

## 2020-10-05 DIAGNOSIS — O99891 Other specified diseases and conditions complicating pregnancy: Secondary | ICD-10-CM | POA: Diagnosis not present

## 2020-10-05 DIAGNOSIS — R252 Cramp and spasm: Secondary | ICD-10-CM

## 2020-10-05 DIAGNOSIS — M5412 Radiculopathy, cervical region: Secondary | ICD-10-CM

## 2020-10-05 DIAGNOSIS — R200 Anesthesia of skin: Secondary | ICD-10-CM

## 2020-10-05 NOTE — Progress Notes (Signed)
Numbness left arm, tingling Worse when she's cold Intermittent  Magnesium supplement at night?

## 2020-10-05 NOTE — Progress Notes (Signed)
Virtual Visit via Video Note  I connected with Natalie Stewart on 10/05/20 at  4:00 PM EDT by a video enabled telemedicine application and verified that I am speaking with the correct person using two identifiers.   I discussed the limitations of evaluation and management by telemedicine and the availability of in person appointments. The patient expressed understanding and agreed to proceed.  Patient location: home Provider locations: office  Subjective:    CC: Left arm numbness and tingling, leg cramps  HPI: Pleasant 35 year old female presenting today via MyChart video visit to discuss left arm numbness/tingling and leg cramps.  Left arm numbness and tingling-noted this new symptom last week.  This is intermittent and often is described as an increase in skin sensitivity.  Notes that using a heating pad makes it feel better and getting cold makes it worse.  She has not had any upper extremity weakness and notes that her pulses and capillary refill are good.  No temperature or color changes to the extremity.  Leg cramps-experiencing leg cramps especially at night and in the morning bilaterally where her calves feel sore.  No recent injuries or increased activity to account for her symptoms.  Read the magnesium supplements may be helpful but wanted to have a second opinion.  Past medical history, Surgical history, Family history not pertinant except as noted below, Social history, Allergies, and medications have been entered into the medical record, reviewed, and corrections made.   Review of Systems: See HPI for pertinent positives and negatives.   Objective:    General: Speaking clearly in complete sentences without any shortness of breath.  Alert and oriented x3.  Normal judgment. No apparent acute distress.  Impression and Recommendations:    1. Cervical radiculopathy Symptoms do seem to be consistent with cervical radiculopathy.  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  Now in pregnancy, her hormones and her body are experiencing big fluctuations.  Suspect this is a temporary situation that will likely resolve itself.  Okay to use heat and massage.  Okay to use Tylenol if significantly painful.  Recommend gentle stretching.  If symptoms change or she begins to experience weakness, color changes, temperature changes, or weak pulses in the upper extremity, recommend coming in for immediate evaluation.  2. Leg cramps in pregnancy Leg cramps in pregnancy are somewhat common.  Recommend addition of an oral magnesium supplement at bedtime as this has been found to be safe during pregnancy and may help promote her comfort.  She will be seeing OB/GYN for her first OB visit next week and they will likely give her more information on medications and supplements that are safe during pregnancy as well as treatment of frequently noted symptoms.  I discussed the assessment and treatment plan with the patient. The patient was provided an opportunity to ask questions and all were answered. The patient agreed with the plan and demonstrated an understanding of the instructions.   The patient was advised to call back or seek an in-person evaluation if the symptoms worsen or if the condition fails to improve as anticipated.  20 minutes of non-face-to-face time was provided during this encounter.  Return if symptoms worsen or fail to improve.  Thayer Ohm, DNP, APRN, FNP-BC [facility] and [facility] Medicine

## 2020-10-08 ENCOUNTER — Other Ambulatory Visit (HOSPITAL_COMMUNITY): Payer: 59

## 2020-10-12 DIAGNOSIS — N911 Secondary amenorrhea: Secondary | ICD-10-CM | POA: Diagnosis not present

## 2020-10-13 ENCOUNTER — Other Ambulatory Visit: Payer: Self-pay

## 2020-10-13 ENCOUNTER — Ambulatory Visit (HOSPITAL_COMMUNITY)
Admission: RE | Admit: 2020-10-13 | Discharge: 2020-10-13 | Disposition: A | Discharge status: Home / Self Care | Payer: 59 | Source: Ambulatory Visit | Attending: Internal Medicine | Admitting: Internal Medicine

## 2020-10-13 DIAGNOSIS — R0602 Shortness of breath: Secondary | ICD-10-CM | POA: Diagnosis not present

## 2020-10-13 DIAGNOSIS — R079 Chest pain, unspecified: Secondary | ICD-10-CM | POA: Insufficient documentation

## 2020-10-13 DIAGNOSIS — R002 Palpitations: Secondary | ICD-10-CM | POA: Diagnosis not present

## 2020-10-13 LAB — ECHOCARDIOGRAM COMPLETE
Area-P 1/2: 3.48 cm2
S' Lateral: 2.6 cm

## 2020-10-13 NOTE — Progress Notes (Signed)
  Echocardiogram 2D Echocardiogram has been performed.  Natalie Stewart Natalie Stewart Natalie Stewart 10/13/2020, 11:00 AM

## 2020-10-15 ENCOUNTER — Other Ambulatory Visit (HOSPITAL_COMMUNITY): Payer: 59

## 2020-10-15 DIAGNOSIS — R002 Palpitations: Secondary | ICD-10-CM | POA: Diagnosis not present

## 2020-10-15 DIAGNOSIS — R079 Chest pain, unspecified: Secondary | ICD-10-CM | POA: Diagnosis not present

## 2020-10-22 ENCOUNTER — Ambulatory Visit: Payer: 59 | Admitting: Cardiology

## 2020-10-22 DIAGNOSIS — Z3481 Encounter for supervision of other normal pregnancy, first trimester: Secondary | ICD-10-CM | POA: Diagnosis not present

## 2020-10-22 DIAGNOSIS — Z3685 Encounter for antenatal screening for Streptococcus B: Secondary | ICD-10-CM | POA: Diagnosis not present

## 2020-10-22 LAB — OB RESULTS CONSOLE RPR: RPR: NONREACTIVE

## 2020-10-22 LAB — OB RESULTS CONSOLE HIV ANTIBODY (ROUTINE TESTING): HIV: NONREACTIVE

## 2020-10-22 LAB — OB RESULTS CONSOLE ANTIBODY SCREEN: Antibody Screen: NEGATIVE

## 2020-10-22 LAB — OB RESULTS CONSOLE ABO/RH: RH Type: POSITIVE

## 2020-10-22 LAB — OB RESULTS CONSOLE GC/CHLAMYDIA
Chlamydia: NEGATIVE
Gonorrhea: NEGATIVE

## 2020-10-22 LAB — OB RESULTS CONSOLE HEPATITIS B SURFACE ANTIGEN: Hepatitis B Surface Ag: NEGATIVE

## 2020-10-22 LAB — HEPATITIS C ANTIBODY: HCV Ab: NEGATIVE

## 2020-10-22 LAB — OB RESULTS CONSOLE RUBELLA ANTIBODY, IGM: Rubella: IMMUNE

## 2020-10-23 ENCOUNTER — Other Ambulatory Visit (HOSPITAL_COMMUNITY): Payer: 59

## 2020-10-30 ENCOUNTER — Other Ambulatory Visit (HOSPITAL_COMMUNITY): Payer: Self-pay

## 2020-11-02 DIAGNOSIS — Z331 Pregnant state, incidental: Secondary | ICD-10-CM | POA: Diagnosis not present

## 2020-11-02 DIAGNOSIS — Z113 Encounter for screening for infections with a predominantly sexual mode of transmission: Secondary | ICD-10-CM | POA: Diagnosis not present

## 2020-11-02 DIAGNOSIS — Z3401 Encounter for supervision of normal first pregnancy, first trimester: Secondary | ICD-10-CM | POA: Diagnosis not present

## 2020-11-02 DIAGNOSIS — Z3A10 10 weeks gestation of pregnancy: Secondary | ICD-10-CM | POA: Diagnosis not present

## 2020-11-05 ENCOUNTER — Ambulatory Visit (INDEPENDENT_AMBULATORY_CARE_PROVIDER_SITE_OTHER): Payer: 59 | Admitting: Internal Medicine

## 2020-11-05 ENCOUNTER — Ambulatory Visit: Payer: 59 | Admitting: Nurse Practitioner

## 2020-11-05 ENCOUNTER — Other Ambulatory Visit: Payer: Self-pay

## 2020-11-05 VITALS — BP 116/68 | HR 77 | Ht 63.0 in | Wt 134.2 lb

## 2020-11-05 DIAGNOSIS — R42 Dizziness and giddiness: Secondary | ICD-10-CM

## 2020-11-05 DIAGNOSIS — R55 Syncope and collapse: Secondary | ICD-10-CM | POA: Diagnosis not present

## 2020-11-05 DIAGNOSIS — R002 Palpitations: Secondary | ICD-10-CM

## 2020-11-05 NOTE — Patient Instructions (Signed)
Medication Instructions:   Your physician recommends that you continue on your current medications as directed. Please refer to the Current Medication list given to you today.  *If you need a refill on your cardiac medications before your next appointment, please call your pharmacy*   Lab Work:  None ordered  Testing/Procedures:  None ordered   Follow-Up: At [facility], you and your health needs are our priority.  As part of our continuing mission to provide you with exceptional heart care, we have created designated Provider Care Teams.  These Care Teams include your primary Cardiologist (physician) and Advanced Practice Providers (APPs -  Physician Assistants and Nurse Practitioners) who all work together to provide you with the care you need, when you need it.  We recommend signing up for the patient portal called "MyChart".  Sign up information is provided on this After Visit Summary.  MyChart is used to connect with patients for Virtual Visits (Telemedicine).  Patients are able to view lab/test results, encounter notes, upcoming appointments, etc.  Non-urgent messages can be sent to your provider as well.   To learn more about what you can do with MyChart, go to [email or web address].    Your next appointment:    Follow up as needed  The format for your next appointment:   In Person  Provider:   You may see Yvonne Kendall, MD or one of the following Advanced Practice Providers on your designated Care Team:   Nicolasa Ducking, NP Eula Listen, PA-C Marisue Ivan, PA-C [place], [place]

## 2020-11-05 NOTE — Progress Notes (Signed)
Follow-up Outpatient Visit Date: 11/05/2020  Primary Care Provider: Samuel Bouche, NP [place] [address]  Chief Complaint: Follow-up palpitations, chest pain, and syncope  HPI:  Natalie Stewart is a 35 y.o. female with history of syncopal episode in 01/2020, who presents for follow-up of chest pain and palpitations.  I met her in late July for evaluation of episodic elevated heart rates that began around the time that she discontinued oral contraceptive therapy.  She discovered that she was pregnant in mid July and was subsequently seen in the ED due to elevated blood pressure associated with palpitations, lightheadedness, dizziness, and vision changes.  ED visit was unrevealing.  Subsequent echocardiogram was normal.  Event monitor showed rare PACs and PVCs as well as a single 6 beat episode of NSVT.  Today, Natalie Stewart reports that she is feeling better.  She has not had any further chest pain.  She still feels her heart beating more forcefully when she lies down at night but has not had any rapid or irregular heartbeats.  She has occasional orthostatic lightheadedness but has not passed out.  She denies shortness of breath.  --------------------------------------------------------------------------------------------------  Cardiovascular History & Procedures: Cardiovascular Problems: Chest pain Palpitations Syncope   Risk Factors: None   Cath/PCI: None   CV Surgery: None   EP Procedures and Devices: 14-day event monitor (09/24/2020): Predominantly sinus rhythm with rare PACs and PVCs as well as a single brief episode of NSVT (6 beats).  No arrhythmia noted to explain the patient's symptoms.   Non-Invasive Evaluation(s): TTE (10/13/2020): Normal LV size and wall thickness.  LVEF 55-60% with normal diastolic function and global longitudinal strain.  Normal RV size and function.  Normal biatrial size.  No significant valvular abnormality.  Trivial pericardial  effusion noted. Bilateral lower extremity venous duplex (09/23/2020): No DVT in either lower extremity.  Recent CV Pertinent Labs: Lab Results  Component Value Date   CHOL 153 08/27/2018   HDL 53 08/27/2018   LDLCALC 80 08/27/2018   TRIG 107 08/27/2018   CHOLHDL 2.9 08/27/2018   BNP 12.3 09/23/2020   K 3.8 09/23/2020   BUN 12 09/23/2020   CREATININE 0.82 09/23/2020   CREATININE 1.15 (H) 08/27/2018    Past medical and surgical history were reviewed and updated in EPIC.  Current Meds  Medication Sig   Prenatal Vit-Fe Fumarate-FA (PRENATAL MULTIVITAMIN) TABS tablet Take 1 tablet by mouth daily at 12 noon.   sertraline (ZOLOFT) 25 MG tablet Take 1 tablet (25 mg total) by mouth daily for 7 days, THEN 2 tablets (50 mg total) daily for 23 days.    Allergies: Patient has no known allergies.  Social History   Tobacco Use   Smoking status: Never   Smokeless tobacco: Never  Vaping Use   Vaping Use: Never used  Substance Use Topics   Alcohol use: Not Currently    Alcohol/week: 1.0 standard drink    Types: 1 Standard drinks or equivalent per week    Comment: 1 per week   Drug use: Never    Family History  Problem Relation Age of Onset   Hypertension Mother    Gallbladder disease Mother    Breast cancer Maternal Grandmother    Bladder Cancer Paternal Grandmother    Dementia Paternal Grandmother    Heart failure Paternal Grandfather    Atrial fibrillation Paternal Grandfather     Review of Systems: A 12-system review of systems was performed and was negative except as  noted in the HPI.  --------------------------------------------------------------------------------------------------  Physical Exam: BP 116/68   Pulse 77   Ht _0  (1.6 m)   Wt 134 lb 3.2 oz (60.9 kg)   SpO2 98%   BMI 23.77 kg/m   General:  NAD. Neck: No JVD or HJR. Lungs: Clear to auscultation bilaterally without wheezes or crackles. Heart: Regular rate and rhythm without murmurs, rubs, or  gallops. Abdomen: Soft, nontender, nondistended. Extremities: No lower extremity edema.  EKG: Normal sinus rhythm without abnormality.  Lab Results  Component Value Date   WBC 9.2 09/23/2020   HGB 13.4 09/23/2020   HCT 38.2 09/23/2020   MCV 87.8 09/23/2020   PLT 219 09/23/2020    Lab Results  Component Value Date   NA 137 09/23/2020   K 3.8 09/23/2020   CL 103 09/23/2020   CO2 24 09/23/2020   BUN 12 09/23/2020   CREATININE 0.82 09/23/2020   GLUCOSE 103 (H) 09/23/2020   ALT 14 09/23/2020    Lab Results  Component Value Date   CHOL 153 08/27/2018   HDL 53 08/27/2018   LDLCALC 80 08/27/2018   TRIG 107 08/27/2018   CHOLHDL 2.9 08/27/2018    --------------------------------------------------------------------------------------------------  ASSESSMENT AND PLAN: Palpitations, lightheadedness, and syncope: Overall symptoms have improved since our last visit.  Interval echocardiogram was normal.  Event monitor showed a single episode of brief nonsustained ventricular tachycardia as well as rare PACs and PVCs.  In the absence of structural abnormalities, I do not think that any further work-up or intervention is indicated at this time.  I encouraged Natalie Stewart to remain well-hydrated.  I think it would be reasonable for her to avoid prolonged standing at work, if possible, as she is at risk for lightheadedness that may be exacerbated by fluid shifts and other physiologic changes as she progresses through her pregnancy.  Follow-up: Return to clinic as needed.  Natalie Bush, Natalie Stewart 11/06/2020 7:13 AM

## 2020-11-06 ENCOUNTER — Encounter: Payer: Self-pay | Admitting: Internal Medicine

## 2020-11-06 DIAGNOSIS — R42 Dizziness and giddiness: Secondary | ICD-10-CM | POA: Insufficient documentation

## 2020-11-16 DIAGNOSIS — R309 Painful micturition, unspecified: Secondary | ICD-10-CM | POA: Diagnosis not present

## 2020-11-18 ENCOUNTER — Other Ambulatory Visit (HOSPITAL_COMMUNITY): Payer: Self-pay

## 2020-11-18 DIAGNOSIS — Z3A12 12 weeks gestation of pregnancy: Secondary | ICD-10-CM | POA: Diagnosis not present

## 2020-11-18 DIAGNOSIS — Z3682 Encounter for antenatal screening for nuchal translucency: Secondary | ICD-10-CM | POA: Diagnosis not present

## 2020-11-18 DIAGNOSIS — Z3481 Encounter for supervision of other normal pregnancy, first trimester: Secondary | ICD-10-CM | POA: Diagnosis not present

## 2020-11-19 ENCOUNTER — Other Ambulatory Visit (HOSPITAL_COMMUNITY): Payer: Self-pay

## 2020-11-19 MED ORDER — AMOXICILLIN-POT CLAVULANATE 875-125 MG PO TABS
1.0000 | ORAL_TABLET | Freq: Two times a day (BID) | ORAL | 0 refills | Status: DC
  Filled 2020-11-19: qty 10, 5d supply, fill #0

## 2020-11-27 ENCOUNTER — Other Ambulatory Visit (HOSPITAL_COMMUNITY): Payer: Self-pay

## 2020-12-01 ENCOUNTER — Other Ambulatory Visit (HOSPITAL_COMMUNITY): Payer: Self-pay

## 2020-12-01 ENCOUNTER — Other Ambulatory Visit: Payer: Self-pay

## 2020-12-01 DIAGNOSIS — F419 Anxiety disorder, unspecified: Secondary | ICD-10-CM

## 2020-12-01 MED ORDER — SERTRALINE HCL 50 MG PO TABS
50.0000 mg | ORAL_TABLET | Freq: Every day | ORAL | 3 refills | Status: DC
  Filled 2020-12-01: qty 30, 30d supply, fill #0
  Filled 2021-02-01: qty 30, 30d supply, fill #1
  Filled 2021-04-05: qty 30, 30d supply, fill #2
  Filled 2021-06-04: qty 30, 30d supply, fill #3

## 2020-12-07 ENCOUNTER — Other Ambulatory Visit: Payer: Self-pay | Admitting: Obstetrics and Gynecology

## 2020-12-07 DIAGNOSIS — Z363 Encounter for antenatal screening for malformations: Secondary | ICD-10-CM

## 2020-12-16 ENCOUNTER — Other Ambulatory Visit: Payer: Self-pay

## 2020-12-28 ENCOUNTER — Encounter: Payer: Self-pay | Admitting: *Deleted

## 2020-12-30 ENCOUNTER — Ambulatory Visit: Payer: 59 | Admitting: *Deleted

## 2020-12-30 ENCOUNTER — Ambulatory Visit (HOSPITAL_BASED_OUTPATIENT_CLINIC_OR_DEPARTMENT_OTHER): Payer: 59 | Admitting: Obstetrics

## 2020-12-30 ENCOUNTER — Encounter: Payer: Self-pay | Admitting: *Deleted

## 2020-12-30 ENCOUNTER — Ambulatory Visit: Payer: 59 | Attending: Obstetrics and Gynecology

## 2020-12-30 ENCOUNTER — Other Ambulatory Visit: Payer: Self-pay

## 2020-12-30 ENCOUNTER — Other Ambulatory Visit: Payer: Self-pay | Admitting: *Deleted

## 2020-12-30 ENCOUNTER — Ambulatory Visit (HOSPITAL_BASED_OUTPATIENT_CLINIC_OR_DEPARTMENT_OTHER): Payer: 59

## 2020-12-30 VITALS — BP 112/71 | HR 84

## 2020-12-30 DIAGNOSIS — O09522 Supervision of elderly multigravida, second trimester: Secondary | ICD-10-CM

## 2020-12-30 DIAGNOSIS — Z363 Encounter for antenatal screening for malformations: Secondary | ICD-10-CM | POA: Diagnosis not present

## 2020-12-30 DIAGNOSIS — O09512 Supervision of elderly primigravida, second trimester: Secondary | ICD-10-CM

## 2020-12-30 DIAGNOSIS — Z3A18 18 weeks gestation of pregnancy: Secondary | ICD-10-CM

## 2020-12-30 DIAGNOSIS — O280 Abnormal hematological finding on antenatal screening of mother: Secondary | ICD-10-CM | POA: Insufficient documentation

## 2020-12-30 DIAGNOSIS — Z362 Encounter for other antenatal screening follow-up: Secondary | ICD-10-CM

## 2020-12-30 NOTE — Progress Notes (Signed)
MFM Note  Natalie Stewart was seen for a detailed fetal anatomy scan due to advanced maternal age and as her cell free DNA test indicated atypical findings on sex chromosomes.  The cell free DNA test also indicated a low risk for trisomy 21, 18, and 13.  She reports that she has experienced intermittent palpitations throughout her current pregnancy.  She had a normal maternal echocardiogram.  The cardiologist did not find any significant cardiac abnormalities or significant cardiac rhythm abnormalities.  She was informed that the fetal growth and amniotic fluid level were appropriate for her gestational age.   There were no obvious fetal anomalies noted on today's ultrasound exam.  A female fetus was noted on today's exam.  The patient was informed that anomalies may be missed due to technical limitations. If the fetus is in a suboptimal position or maternal habitus is increased, visualization of the fetus in the maternal uterus may be impaired.  The increased risk of fetal aneuploidy due to advanced maternal age was discussed.   Due to advanced maternal age and the atypical findings noted on her cell free DNA test, the patient was offered and declined an amniocentesis today for definitive diagnosis of fetal aneuploidy and fetal sex chromosome abnormalities.  Due to her abnormal cell free DNA test, we will continue to follow her with growth ultrasounds throughout her pregnancy.  The patient is meeting with our genetic counselor following today's ultrasound exam.  A follow-up exam was scheduled in 4 weeks.  A total of 30 minutes was spent counseling and coordinating the care for this patient.  Greater than 50% of the time was spent in direct face-to-face contact.

## 2020-12-30 NOTE — Progress Notes (Signed)
Name: Natalie Stewart Indication: Atypical finding on NIPS  DOB: [birth year] Age: 35 y.o.   EDC: 05/30/2021 LMP: 08/23/2020 Referring Provider: Belva Agee, MD  EGA: [redacted]w[redacted]d Genetic Counselor: Teena Dunk, MS, CGC  OB Hx: G1P0 Date of Appointment: 12/30/2020  Accompanied by: Father of the pregnancy, Diamantina Monks Face to Face Time: 45 Minutes   Previous Testing Completed: CBC from 09/23/2020 reviewed. MCV within normal limits. It is unlikely that Natalie Stewart is a beta thalassemia carrier or an alpha thalassemia carrier of the double-gene deletion. Individuals with a normal MCV may be single-gene deletion carriers, but it is unlikely that the current pregnancy would be affected with alpha or beta thalassemia major. Natalie Stewart previously completed [facility] Carrier Screening in this pregnancy (scanned into Epic under the Media tab). She screened to not be a carrier for Cystic Fibrosis (CF), Spinal Muscular Atrophy (SMA). A negative result on carrier screening reduces the likelihood of being a carrier, however, does not entirely rule out the possibility.   Medical History:  Reports she takes prenatal vitamins. Denies personal history of diabetes, high blood pressure, thyroid conditions, and seizures. Denies bleeding, infections, and fevers in this pregnancy. Denies using tobacco, alcohol, or street drugs in this pregnancy.   Family History: A pedigree was created and scanned into Epic under the Media tab. Natalie Stewart reports her mother had 9 miscarriages. Natalie Stewart has one healthy sister. Natalie Stewart reports her father may have had genetic testing but she is not sure what he had genetic testing for or what the results were. Genetic counseling encouraged her to attempt to get more details about her father's potential genetic testing and results. Natalie Stewart reports his mother had a small hole in her heart which closed on its own and did not require surgery. Maternal ethnicity reported as Caucasian and paternal ethnicity reported as  Caucasian. Denies Ashkenazi Jewish ancestry. Family history not remarkable for consanguinity, intellectual disability, autism spectrum disorder, mental illness, still births, or unexplained neonatal death.     Genetic Counseling:   Atypical Non-Invasive Prenatal Screen Result. Natalie Stewart previously completed Non-Invasive Prenatal Screening (NIPS) in this pregnancy (scanned into Epic under the Media tab). The result is low risk for Down syndrome, Trisomy 18, and Trisomy 13. There was no result reported for sex chromosome abnormalities due to a suspected atypical finding. The laboratory reports a suspected maternal finding outside the scope of the test involving the Y chromosome, which may include, but is not limited to, maternal mosaicism, maternal chromosome abnormality, or normal variation. Fetal risk assessment for monosomy X and fetal sex could not be performed. This result was reviewed with Natalie Stewart in detail. Given that the atypical finding is expected to be of maternal origin, genetic counseling offered Natalie Stewart maternal blood chromosome analysis. Genetic counseling also offered Natalie Stewart amniocentesis for fetal chromosome analysis and microarray studies. Natalie Stewart declined the maternal blood chromosome analysis and amniocentesis for prenatal diagnosis at this time. Natalie Stewart stated that she felt reassured by the normal appearing ultrasound today and understands that ultrasound is not diagnostic.  Birth Defects. All babies have approximately a 3-5% risk for a birth defect and a majority of these defects cannot be detected through the screening or diagnostic testing listed below. Ultrasound may detect some birth defects, but it may not detect all birth defects. About half of pregnancies with Down syndrome do not show any soft markers on ultrasound. A normal ultrasound does not guarantee a healthy pregnancy.   Testing/Screening Options:   Amniocentesis. This procedure is available for prenatal diagnosis. Possible  procedural  difficulties and complications that can arise include maternal infection, cramping, bleeding, fluid leakage, and/or pregnancy loss. The risk for pregnancy loss with an amniocentesis is 1/500. Per the [employer]necologists (ACOG) Practice Bulletin 162, all pregnant women should be offered prenatal assessment for aneuploidy by diagnostic testing regardless of maternal age or other risk factors. If indicated, genetic testing that could be ordered on an amniocentesis sample includes a fetal karyotype, fetal microarray, and testing for specific syndromes.  Carrier screening. Per the ACOG Committee Opinion 691, all women who are considering a pregnancy or are currently pregnant should be offered carrier screening for, at minimum, Cystic Fibrosis (CF), Spinal Muscular Atrophy (SMA), and Hemoglobinopathies. Genetic counseling also offered Natalie Stewart the option of Expanded Carrier Screening. The mode of inheritance, clinical manifestations of these conditions, as well as details about testing were reviewed. A negative result on carrier screening reduces the likelihood of being a carrier, however, does not entirely rule out the possibility. If Natalie Stewart was found to be a carrier for a specific condition, carrier screening for their reproductive partner would be recommended.    Patient Plan:  Proceed with: Routine prenatal care Informed consent was obtained. All questions were answered.  Declined: Amniocentesis, Maternal Blood Chromosome Analysis, Expanded Carrier Screening   Thank you for sharing in the care of Natalie Stewart with [place].  Please do not hesitate to contact us if you have any questions.  Teena Dunk, MS, [facility]

## 2021-01-01 ENCOUNTER — Ambulatory Visit: Payer: Self-pay

## 2021-01-05 DIAGNOSIS — Z3685 Encounter for antenatal screening for Streptococcus B: Secondary | ICD-10-CM | POA: Diagnosis not present

## 2021-01-27 ENCOUNTER — Encounter: Payer: Self-pay | Admitting: *Deleted

## 2021-01-27 ENCOUNTER — Ambulatory Visit: Payer: 59 | Admitting: *Deleted

## 2021-01-27 ENCOUNTER — Other Ambulatory Visit: Payer: Self-pay

## 2021-01-27 ENCOUNTER — Ambulatory Visit: Payer: 59 | Attending: Obstetrics

## 2021-01-27 VITALS — BP 116/62 | HR 69

## 2021-01-27 DIAGNOSIS — Z3A22 22 weeks gestation of pregnancy: Secondary | ICD-10-CM

## 2021-01-27 DIAGNOSIS — O09522 Supervision of elderly multigravida, second trimester: Secondary | ICD-10-CM | POA: Insufficient documentation

## 2021-01-27 DIAGNOSIS — Z362 Encounter for other antenatal screening follow-up: Secondary | ICD-10-CM | POA: Diagnosis not present

## 2021-01-27 IMAGING — US US MFM OB FOLLOW-UP
1 series · 13 of 28 positions shown · non-contrast
Comparison: none

[Series 1: us mfm ob follow-up · 13 of 82 slices shown]
[im 4/82]
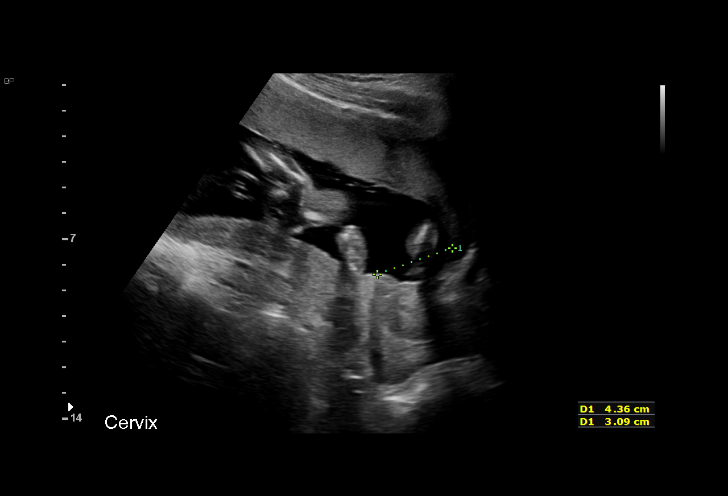
[im 10/82]
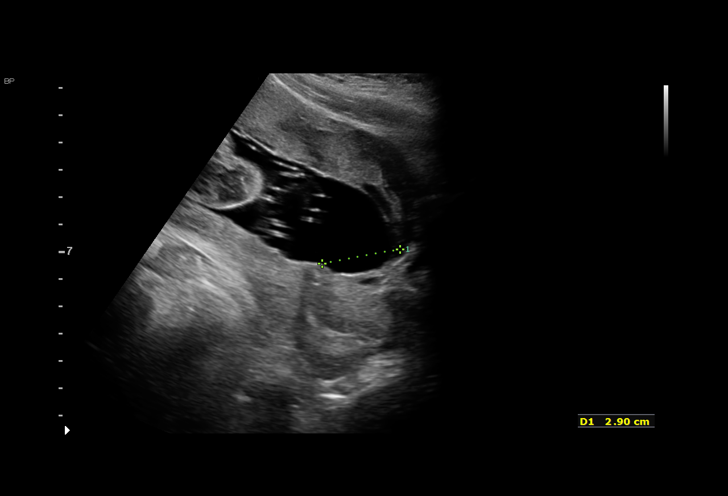
[im 16/82]
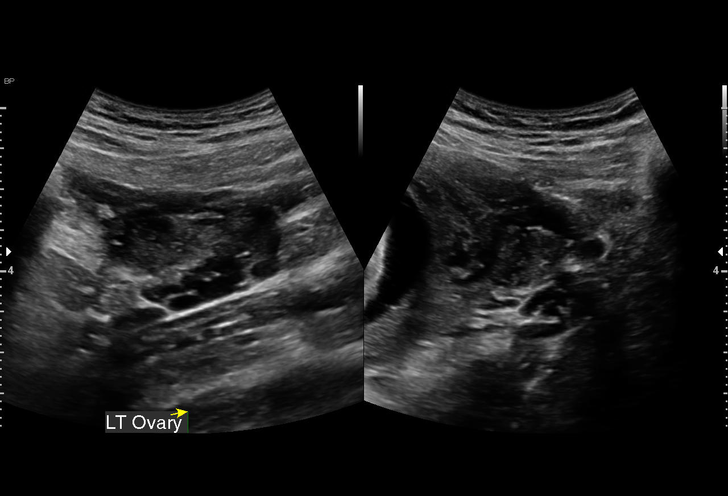
[im 22/82]
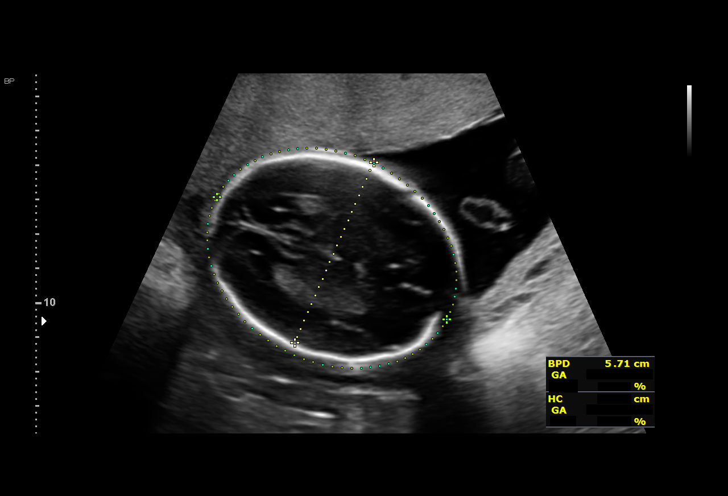
[im 28/82]
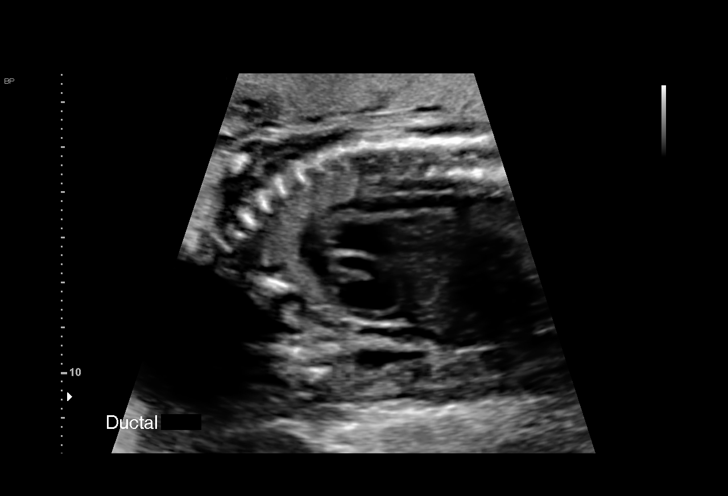
[im 34/82]
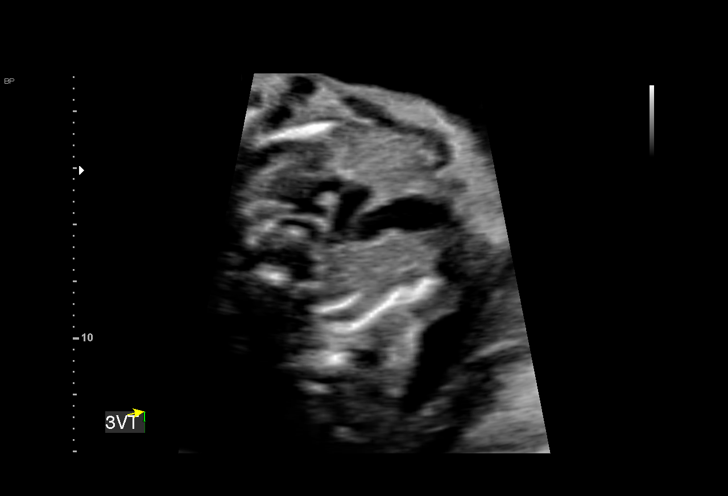
[im 43/82]
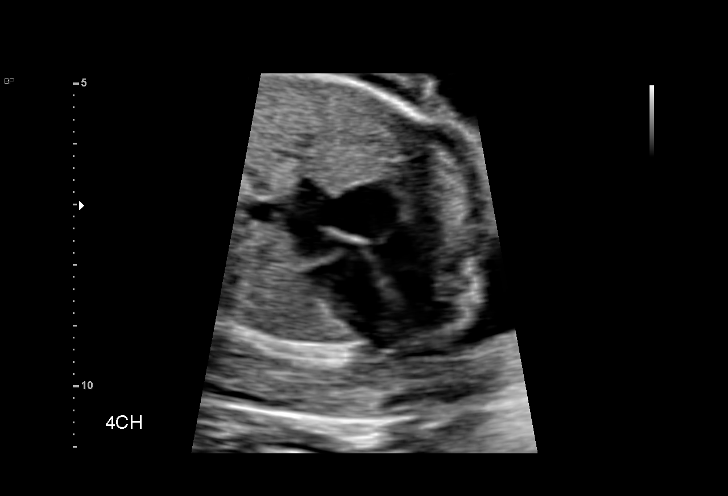
[im 49/82]
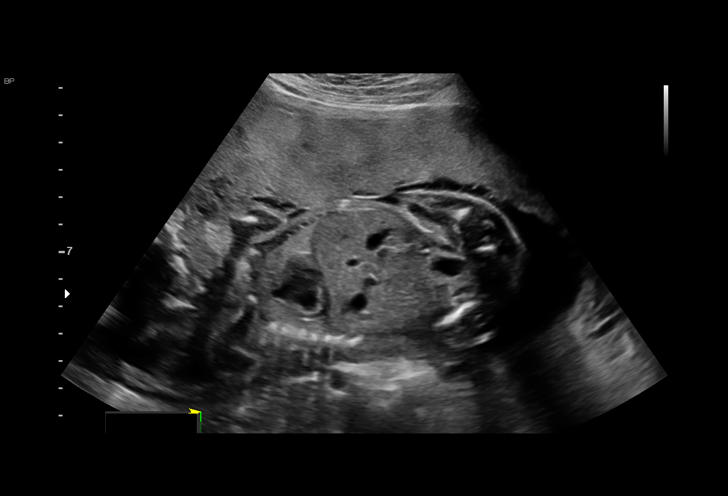
[im 55/82]
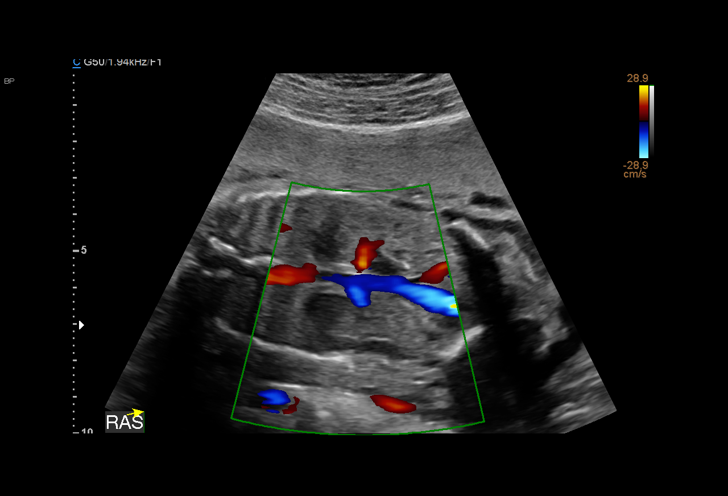
[im 61/82]
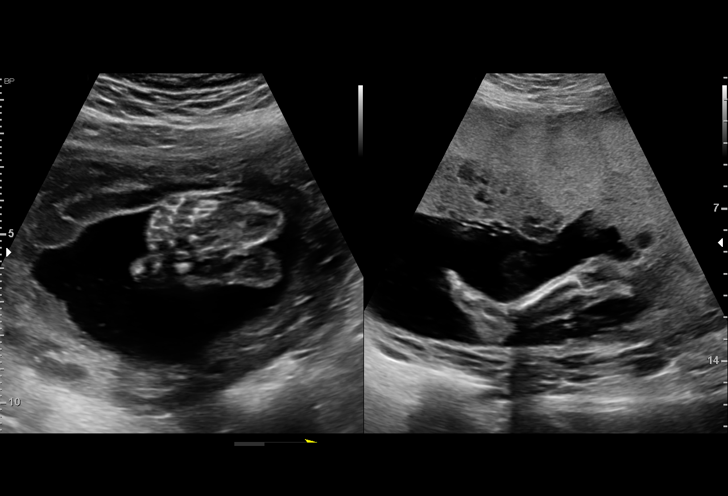
[im 67/82]
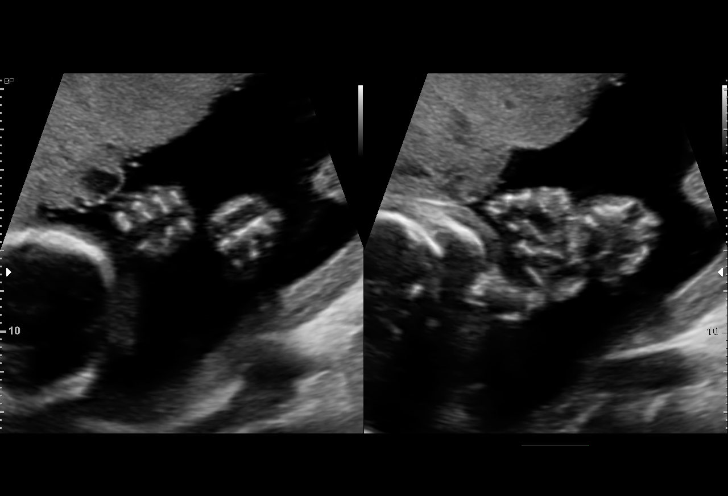
[im 73/82]
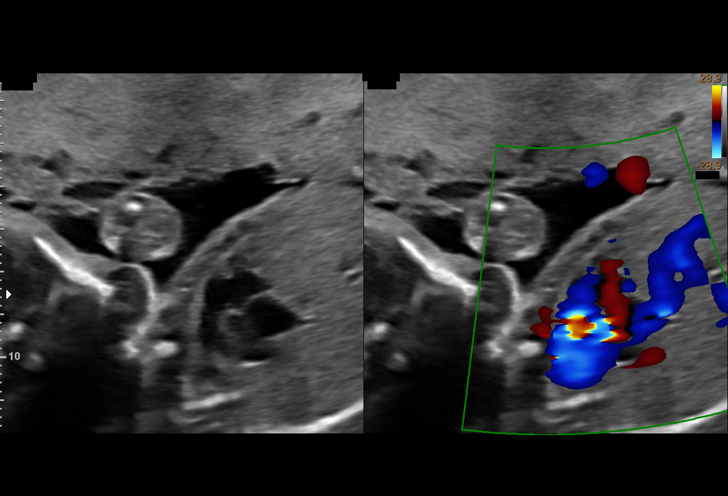
[im 79/82]
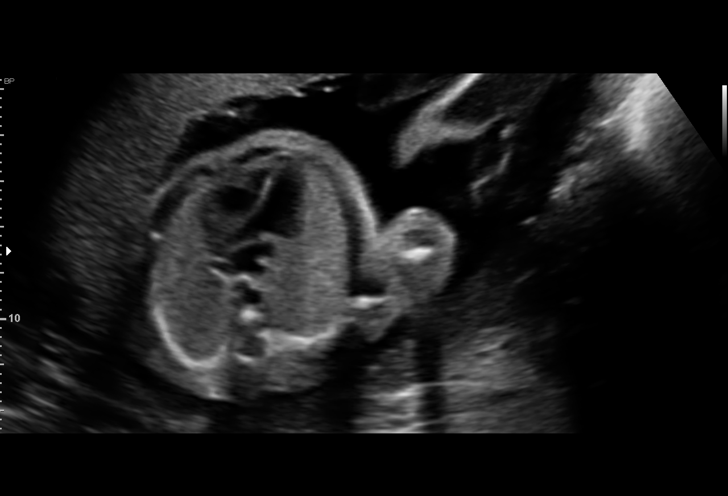

[13 of 28 positions shown; findings below may reference images not displayed]

Indications

 Advanced maternal age multigravida 35+,        [LX]
 second trimester (36 by EDD)
 Abnormal biochemical screen (Atypical          [LX]
 finding on sex chromosomes)
 LOW risk NIPS
 22 weeks gestation of pregnancy
Fetal Evaluation

 Num Of Fetuses:         1
 Fetal Heart Rate(bpm):  143
 Cardiac Activity:       Observed
 Presentation:           Breech, footling
 Placenta:               Anterior
 P. Cord Insertion:      Previously Visualized

 Amniotic Fluid
 AFI FV:      Within normal limits

                             Largest Pocket(cm)

Biometry

 BPD:      56.7  mm     G. Age:  23w 2d         79  %    CI:        70.83   %    70 - 86
                                                         FL/HC:      16.9   %    18.4 -
 HC:      214.7  mm     G. Age:  23w 4d         80  %    HC/AC:      1.15        1.06 -
 AC:      187.3  mm     G. Age:  23w 4d         76  %    FL/BPD:     64.0   %    71 - 87
 FL:       36.3  mm     G. Age:  21w 4d         14  %    FL/AC:      19.4   %    20 - 24
 HUM:      35.4  mm     G. Age:  22w 2d         41  %

 LV:        4.4  mm
 Est. FW:     531  gm      1 lb 3 oz     60  %
OB History

 Gravidity:    1         Term:   0        Prem:   0        SAB:   0
 TOP:          0       Ectopic:  0        Living: 0
Gestational Age

 LMP:           22w 3d        Date:  [DATE]                 EDD:   [DATE]
 U/S Today:     23w 0d                                        EDD:   [DATE]
 Best:          22w 3d     Det. By:  LMP  ([DATE])          EDD:   [DATE]
Anatomy

 Cranium:               Appears normal         Aortic Arch:            Appears normal
 Cavum:                 Appears normal         Ductal Arch:            Appears normal
 Ventricles:            Appears normal         Diaphragm:              Appears normal
 Choroid Plexus:        Appears normal         Stomach:                Appears normal, left
                                                                       sided
 Cerebellum:            Appears normal         Abdomen:                Previously seen
 Posterior Fossa:       Appears normal         Abdominal Wall:         Previously seen
 Nuchal Fold:           Not applicable (>20    Cord Vessels:           Appears normal (3
                        wks GA)                                        vessel cord)
 Face:                  Orbits and profile     Kidneys:                Appear normal
                        previously seen
 Lips:                  Previously seen        Bladder:                Appears normal
 Thoracic:              Appears normal         Spine:                  Previously seen
 Heart:                 Appears normal         Upper Extremities:      Previously seen
                        (4CH, axis, and
                        situs)
 RVOT:                  Appears normal         Lower Extremities:      Previously seen
 LVOT:                  Appears normal

 Other:  Fetus appears to be a male. VC, 3VV and 3VTV visualized.
         Heels/feet and open hands/5th digits visualized. Technically difficult
         due to fetal position.
Cervix Uterus Adnexa

 Cervix
 Length:            4.4  cm.
 Normal appearance by transabdominal scan.

 Uterus
 Normal shape and size.

 Right Ovary
 Not visualized.

 Left Ovary
 Within normal limits.
 Cul De Sac
 No free fluid seen.

 Adnexa
 No adnexal mass visualized.
Impression

 Follow up growth due to completee fetal anatomy
 Normal interval growth with measurements consistent with
 dates
 Good fetal movement and amniotic fluid volume

 She had a prior low lying placenta that was cleared today.

 The fetal anatomy was cleared as well.

 All questions answered.
Recommendations

 Follow up growth as clinically indicated.

## 2021-02-01 ENCOUNTER — Other Ambulatory Visit (HOSPITAL_COMMUNITY): Payer: Self-pay

## 2021-03-07 NOTE — L&D Delivery Note (Signed)
Delivery Note ?At 1:48 AM a viable female was delivered via Vaginal, Spontaneous (Presentation: Left Occiput Anterior).  APGAR: 8, 9; weight  .   ?Placenta status: Spontaneous, Intact.  Cord: 3 vessels with the following complications: None.  Cord pH: not sent ?Pushed x 45 minutes.  ?Nuchal x 2 reduced after delivery.  ?Easy delivery of fetal vertex and body.  ?Uterine atony and deep second degree tear noted.  ?Uterine atony required full round of uterotonics - pitocin, TXA, hemabate, methergine, and cytotec. Ultimately controlled and tone excellent FF at [facility].  ? ?Anesthesia: Epidural ?Episiotomy: None ?Lacerations: 2nd degree - sterile RV exam without any evidence of occult 3rd or 4th degree tear ?Suture Repair: 2.0 3.0 ?Est. Blood Loss (mL): 800cc ? ? ?It's a boy - "Natalie Stewart"!!   ?Mom to postpartum.  Baby to Couplet care / Skin to Skin. ? ?[name] ?05/21/2021, 2:19 AM ? ? ? ?

## 2021-03-18 DIAGNOSIS — Z3480 Encounter for supervision of other normal pregnancy, unspecified trimester: Secondary | ICD-10-CM | POA: Diagnosis not present

## 2021-03-24 DIAGNOSIS — O99810 Abnormal glucose complicating pregnancy: Secondary | ICD-10-CM | POA: Diagnosis not present

## 2021-04-05 ENCOUNTER — Other Ambulatory Visit (HOSPITAL_COMMUNITY): Payer: Self-pay

## 2021-04-08 ENCOUNTER — Other Ambulatory Visit (HOSPITAL_COMMUNITY): Payer: Self-pay

## 2021-04-08 MED ORDER — FREESTYLE LITE W/DEVICE KIT
1.0000 | PACK | 0 refills | Status: DC
  Filled 2021-04-08: qty 1, 1d supply, fill #0

## 2021-04-08 MED ORDER — FREESTYLE LITE TEST VI STRP
ORAL_STRIP | 0 refills | Status: DC
  Filled 2021-04-08: qty 100, 25d supply, fill #0

## 2021-04-08 MED ORDER — FREESTYLE LANCETS MISC
0 refills | Status: DC
  Filled 2021-04-08: qty 100, 25d supply, fill #0

## 2021-04-21 ENCOUNTER — Encounter: Payer: 59 | Attending: Obstetrics and Gynecology | Admitting: Registered"

## 2021-05-06 DIAGNOSIS — Z3685 Encounter for antenatal screening for Streptococcus B: Secondary | ICD-10-CM | POA: Diagnosis not present

## 2021-05-10 LAB — OB RESULTS CONSOLE GBS: GBS: NEGATIVE

## 2021-05-11 DIAGNOSIS — Z3483 Encounter for supervision of other normal pregnancy, third trimester: Secondary | ICD-10-CM | POA: Diagnosis not present

## 2021-05-11 DIAGNOSIS — Z3482 Encounter for supervision of other normal pregnancy, second trimester: Secondary | ICD-10-CM | POA: Diagnosis not present

## 2021-05-15 ENCOUNTER — Inpatient Hospital Stay (HOSPITAL_COMMUNITY)
Admission: AD | Admit: 2021-05-15 | Discharge: 2021-05-16 | Disposition: A | Discharge status: Home / Self Care | Payer: 59 | Attending: Obstetrics and Gynecology | Admitting: Obstetrics and Gynecology

## 2021-05-15 ENCOUNTER — Other Ambulatory Visit: Payer: Self-pay

## 2021-05-15 ENCOUNTER — Encounter (HOSPITAL_COMMUNITY): Payer: Self-pay | Admitting: Obstetrics and Gynecology

## 2021-05-15 DIAGNOSIS — O471 False labor at or after 37 completed weeks of gestation: Secondary | ICD-10-CM | POA: Diagnosis not present

## 2021-05-15 DIAGNOSIS — Z3A37 37 weeks gestation of pregnancy: Secondary | ICD-10-CM | POA: Insufficient documentation

## 2021-05-15 DIAGNOSIS — O09513 Supervision of elderly primigravida, third trimester: Secondary | ICD-10-CM | POA: Diagnosis not present

## 2021-05-15 DIAGNOSIS — Z0371 Encounter for suspected problem with amniotic cavity and membrane ruled out: Secondary | ICD-10-CM | POA: Insufficient documentation

## 2021-05-15 HISTORY — DX: Gestational diabetes mellitus in pregnancy, unspecified control: O24.419

## 2021-05-15 LAB — POCT FERN TEST: POCT Fern Test: NEGATIVE

## 2021-05-15 NOTE — MAU Provider Note (Signed)
S: Ms. Natalie Stewart is a 36 y.o. G1P0 at [redacted]w[redacted]d  who presents to MAU today complaining of leaking of fluid since 7pm, intercourse was at 5pm. She denies vaginal bleeding. She endorses contractions. She reports normal fetal movement.   ? ?O: BP 119/77   Pulse (!) 59   Temp 98.1 ?F (36.7 ?C)   Resp 16   Ht 5\' 3"  (1.6 m)   Wt 73.9 kg   LMP 08/23/2020   SpO2 97%   BMI 28.86 kg/m?  ?GENERAL: Well-developed, well-nourished female in no acute distress.  ?HEAD: Normocephalic, atraumatic.  ?CHEST: Normal effort of breathing, regular heart rate ?ABDOMEN: Soft, nontender, gravid ?PELVIC: Normal external female genitalia. Vagina is pink and rugated. Cervix with normal contour, no lesions. Normal discharge.  Negative pooling. + small amount of thin, white discharge.  ? ?Cervical exam: deferred  ? ?Fetal Monitoring: ?Baseline: 120 bpm ?Variability: Moderate  ?Accelerations: 15x15 ?Decelerations: None ?Contractions: Occasional  ? ?Results for orders placed or performed during the hospital encounter of 05/15/21 (from the past 24 hour(s))  ?POCT fern test     Status: Normal  ? Collection Time: 05/15/21 11:57 PM  ?Result Value Ref Range  ? POCT Fern Test Negative = intact amniotic membranes   ? ? ? ?A: ?[facility]UP at [redacted]w[redacted]d  ?Membranes intact ?Fern negative x 2 ? ?P: ? ?DC home ?Return to MAU if symptoms worsen  ? ?[redacted]w[redacted]d, NP ?05/16/2021 5:19 AM  ?

## 2021-05-15 NOTE — MAU Note (Signed)
Natalie Stewart is a 36 y.o. at [redacted]w[redacted]d here in MAU reporting: Leaking of fluid around 7pm tonight and has continued. Had irregular ctx. +FM ?LMP:  ?Onset of complaint: 7pm ?Pain score: 2/10 back ?Vitals:  ? 05/15/21 2302  ?BP: 113/77  ?Pulse: 75  ?Resp: 16  ?Temp: 98.1 ?F (36.7 ?C)  ?SpO2: 98%  ?   ?FHT:150 ? ?Lab orders placed from triage:  ? ?

## 2021-05-17 ENCOUNTER — Telehealth (HOSPITAL_COMMUNITY): Payer: Self-pay | Admitting: *Deleted

## 2021-05-17 NOTE — Telephone Encounter (Signed)
Preadmission screen  

## 2021-05-19 ENCOUNTER — Telehealth (HOSPITAL_COMMUNITY): Payer: Self-pay | Admitting: *Deleted

## 2021-05-19 NOTE — Telephone Encounter (Signed)
Preadmission screen  

## 2021-05-20 ENCOUNTER — Telehealth (HOSPITAL_COMMUNITY): Payer: Self-pay | Admitting: *Deleted

## 2021-05-20 ENCOUNTER — Inpatient Hospital Stay (HOSPITAL_COMMUNITY)
Admission: AD | Admit: 2021-05-20 | Discharge: 2021-05-22 | DRG: 807 | Disposition: A | Discharge status: Home / Self Care | Payer: 59 | Attending: Obstetrics and Gynecology | Admitting: Obstetrics and Gynecology

## 2021-05-20 ENCOUNTER — Inpatient Hospital Stay (HOSPITAL_COMMUNITY): Payer: 59 | Admitting: Anesthesiology

## 2021-05-20 ENCOUNTER — Other Ambulatory Visit: Payer: Self-pay

## 2021-05-20 ENCOUNTER — Encounter (HOSPITAL_COMMUNITY): Payer: Self-pay | Admitting: Obstetrics and Gynecology

## 2021-05-20 DIAGNOSIS — Z3490 Encounter for supervision of normal pregnancy, unspecified, unspecified trimester: Principal | ICD-10-CM

## 2021-05-20 DIAGNOSIS — O99344 Other mental disorders complicating childbirth: Secondary | ICD-10-CM | POA: Diagnosis not present

## 2021-05-20 DIAGNOSIS — F419 Anxiety disorder, unspecified: Secondary | ICD-10-CM | POA: Diagnosis present

## 2021-05-20 DIAGNOSIS — O24429 Gestational diabetes mellitus in childbirth, unspecified control: Secondary | ICD-10-CM | POA: Diagnosis not present

## 2021-05-20 DIAGNOSIS — Z3A38 38 weeks gestation of pregnancy: Secondary | ICD-10-CM

## 2021-05-20 DIAGNOSIS — O24420 Gestational diabetes mellitus in childbirth, diet controlled: Secondary | ICD-10-CM | POA: Diagnosis present

## 2021-05-20 DIAGNOSIS — O9902 Anemia complicating childbirth: Secondary | ICD-10-CM | POA: Diagnosis not present

## 2021-05-20 DIAGNOSIS — O4292 Full-term premature rupture of membranes, unspecified as to length of time between rupture and onset of labor: Principal | ICD-10-CM | POA: Diagnosis present

## 2021-05-20 DIAGNOSIS — D649 Anemia, unspecified: Secondary | ICD-10-CM | POA: Diagnosis not present

## 2021-05-20 LAB — CBC
HCT: 33.7 % — ABNORMAL LOW (ref 36.0–46.0)
Hemoglobin: 11.4 g/dL — ABNORMAL LOW (ref 12.0–15.0)
MCH: 30 pg (ref 26.0–34.0)
MCHC: 33.8 g/dL (ref 30.0–36.0)
MCV: 88.7 fL (ref 80.0–100.0)
Platelets: 153 10*3/uL (ref 150–400)
RBC: 3.8 MIL/uL — ABNORMAL LOW (ref 3.87–5.11)
RDW: 13.4 % (ref 11.5–15.5)
WBC: 9.1 10*3/uL (ref 4.0–10.5)
nRBC: 0 % (ref 0.0–0.2)

## 2021-05-20 LAB — GLUCOSE, CAPILLARY
Glucose-Capillary: 82 mg/dL (ref 70–99)
Glucose-Capillary: 82 mg/dL (ref 70–99)
Glucose-Capillary: 83 mg/dL (ref 70–99)

## 2021-05-20 LAB — TYPE AND SCREEN
ABO/RH(D): O POS
Antibody Screen: NEGATIVE

## 2021-05-20 LAB — POCT FERN TEST: POCT Fern Test: POSITIVE

## 2021-05-20 MED ORDER — OXYTOCIN-SODIUM CHLORIDE 30-0.9 UT/500ML-% IV SOLN
2.5000 [IU]/h | INTRAVENOUS | Status: DC
  Administered 2021-05-21: 2.5 [IU]/h via INTRAVENOUS

## 2021-05-20 MED ORDER — HYDROXYZINE HCL 50 MG PO TABS: 50.0000 mg | ORAL_TABLET | Freq: Four times a day (QID) | ORAL | Status: DC | PRN

## 2021-05-20 MED ORDER — LACTATED RINGERS IV SOLN
500.0000 mL | INTRAVENOUS | Status: DC | PRN
  Administered 2021-05-21: 500 mL via INTRAVENOUS

## 2021-05-20 MED ORDER — LACTATED RINGERS IV SOLN: 500.0000 mL | Freq: Once | INTRAVENOUS | Status: DC

## 2021-05-20 MED ORDER — BUTORPHANOL TARTRATE 1 MG/ML IJ SOLN: 1.0000 mg | INTRAMUSCULAR | Status: DC | PRN

## 2021-05-20 MED ORDER — ZOLPIDEM TARTRATE 5 MG PO TABS: 5.0000 mg | ORAL_TABLET | Freq: Every evening | ORAL | Status: DC | PRN

## 2021-05-20 MED ORDER — PHENYLEPHRINE 40 MCG/ML (10ML) SYRINGE FOR IV PUSH (FOR BLOOD PRESSURE SUPPORT): 80.0000 ug | PREFILLED_SYRINGE | INTRAVENOUS | Status: DC | PRN

## 2021-05-20 MED ORDER — OXYTOCIN-SODIUM CHLORIDE 30-0.9 UT/500ML-% IV SOLN
1.0000 m[IU]/min | INTRAVENOUS | Status: DC
  Administered 2021-05-20: 2 m[IU]/min via INTRAVENOUS
  Filled 2021-05-20: qty 500

## 2021-05-20 MED ORDER — OXYCODONE-ACETAMINOPHEN 5-325 MG PO TABS: 2.0000 | ORAL_TABLET | ORAL | Status: DC | PRN

## 2021-05-20 MED ORDER — LIDOCAINE HCL (PF) 1 % IJ SOLN: 30.0000 mL | INTRAMUSCULAR | Status: DC | PRN

## 2021-05-20 MED ORDER — LACTATED RINGERS IV SOLN: INTRAVENOUS | Status: DC

## 2021-05-20 MED ORDER — OXYCODONE-ACETAMINOPHEN 5-325 MG PO TABS: 1.0000 | ORAL_TABLET | ORAL | Status: DC | PRN

## 2021-05-20 MED ORDER — DIPHENHYDRAMINE HCL 50 MG/ML IJ SOLN: 12.5000 mg | INTRAMUSCULAR | Status: DC | PRN

## 2021-05-20 MED ORDER — PHENYLEPHRINE 40 MCG/ML (10ML) SYRINGE FOR IV PUSH (FOR BLOOD PRESSURE SUPPORT)
80.0000 ug | PREFILLED_SYRINGE | INTRAVENOUS | Status: DC | PRN
  Administered 2021-05-20: 80 ug via INTRAVENOUS

## 2021-05-20 MED ORDER — SOD CITRATE-CITRIC ACID 500-334 MG/5ML PO SOLN: 30.0000 mL | ORAL | Status: DC | PRN

## 2021-05-20 MED ORDER — LIDOCAINE HCL (PF) 1 % IJ SOLN
INTRAMUSCULAR | Status: DC | PRN
  Administered 2021-05-20 (×2): 5 mL via EPIDURAL

## 2021-05-20 MED ORDER — EPHEDRINE 5 MG/ML INJ: 10.0000 mg | INTRAVENOUS | Status: DC | PRN

## 2021-05-20 MED ORDER — TERBUTALINE SULFATE 1 MG/ML IJ SOLN: 0.2500 mg | Freq: Once | INTRAMUSCULAR | Status: DC | PRN

## 2021-05-20 MED ORDER — FENTANYL-BUPIVACAINE-NACL 0.5-0.125-0.9 MG/250ML-% EP SOLN
12.0000 mL/h | EPIDURAL | Status: DC | PRN
  Administered 2021-05-20: 12 mL/h via EPIDURAL
  Filled 2021-05-20: qty 250

## 2021-05-20 MED ORDER — OXYTOCIN BOLUS FROM INFUSION
333.0000 mL | Freq: Once | INTRAVENOUS | Status: AC
  Administered 2021-05-21: 333 mL via INTRAVENOUS

## 2021-05-20 MED ORDER — ACETAMINOPHEN 325 MG PO TABS: 650.0000 mg | ORAL_TABLET | ORAL | Status: DC | PRN

## 2021-05-20 MED ORDER — SERTRALINE HCL 25 MG PO TABS
25.0000 mg | ORAL_TABLET | Freq: Every day | ORAL | Status: DC
  Filled 2021-05-20 (×2): qty 1

## 2021-05-20 MED ORDER — ONDANSETRON HCL 4 MG/2ML IJ SOLN: 4.0000 mg | Freq: Four times a day (QID) | INTRAMUSCULAR | Status: DC | PRN

## 2021-05-20 NOTE — H&P (Addendum)
Natalie Stewart is a 36 y.o. female presenting for PROM. ?This pregnancy has been c/b hx A1GDM with excellent control. She also had abnormal NIPS with "atypical sex chromosomes". They were followed by MFM and had reassuring anatomy and growth scans. They declined definitive testing with amnio.  ?She has a history of heart palpitations not requiring intervention (s/p zio patch prior to pregnancy, no palpitation concerns in pregnancy) in addition to a history of acid reflux. She has a history of anxiety c/b sertraline 25mg .  ? ? ?OB History   ? ? Gravida  ?1  ? Para  ?   ? Term  ?   ? Preterm  ?   ? AB  ?   ? Living  ?   ?  ? ? SAB  ?   ? IAB  ?   ? Ectopic  ?   ? Multiple  ?   ? Live Births  ?   ?   ?  ?  ? ?Past Medical History:  ?Diagnosis Date  ? Anxiety   ? Gestational diabetes   ? No pertinent past medical history   ? ?Past Surgical History:  ?Procedure Laterality Date  ? NO PAST SURGERIES    ? ?Family History: family history includes Atrial fibrillation in her paternal grandfather; Bladder Cancer in her paternal grandmother; Breast cancer in her maternal grandmother; Cancer in her maternal grandmother; Dementia in her paternal grandmother; Gallbladder disease in her mother; Heart failure in her paternal grandfather; Hypertension in her mother. ?Social History:  reports that she has never smoked. She has never used smokeless tobacco. She reports that she does not currently use alcohol after a past usage of about 1.0 standard drink per week. She reports that she does not use drugs. ? ? ?  ?Maternal Diabetes: Yes:  Diabetes Type:  Diet controlled ?Genetic Screening: Abnormal:  Results: Other: atypical sex chromosome ?Maternal Ultrasounds/Referrals: Normal ?Fetal Ultrasounds or other Referrals:  None ?Maternal Substance Abuse:  No ?Significant Maternal Medications:  None ?Significant Maternal Lab Results:  None and Group B Strep negative ?Other Comments:  None ? ?Review of Systems ?History ?Dilation: 1 ?Effacement (%):  80 ?Station: -3 ?Exam by:: [email or web address], RN ?Blood pressure 120/83, pulse 83, temperature 98.4 ?F (36.9 ?C), temperature source Oral, resp. rate 16, last menstrual period 08/23/2020, SpO2 99 %. ?Exam ?Physical Exam  ?NAD, A&O ?NWOB ?Abd soft, nondistended, gravid ? ?Prenatal labs: ?ABO, Rh: O/Positive/-- (08/18 0000) ?Antibody: Negative (08/18 0000) ?Rubella: Immune (08/18 0000) ?RPR: Nonreactive (08/18 0000)  ?HBsAg: Negative (08/18 0000)  ?HIV: Non-reactive (08/18 0000)  ?GBS:   negative ? ?Assessment/Plan: ?36 yo G1p0 @ 27 wga with PROM.  ?Hx atypical sex chromosome findings.  ?A1GDM - good control. BS per L&D protocol.  ?Pitocin augmentation and she is acontractile. ?GBS negative.   ? ?20 ?05/20/2021, 1:54 PM ? ? ? ? ?

## 2021-05-20 NOTE — Progress Notes (Signed)
Very uncomfortable with contractions. Nitrous helping slightly. ?Will get CLE. ?SVE  3.5/100/-2. ?Continue pitocin.  ?

## 2021-05-20 NOTE — Progress Notes (Signed)
Contractions 7/10 ?Pitocin at 4 ?SVE 3/100/-2 ?Continue pitocin ?

## 2021-05-20 NOTE — Anesthesia Procedure Notes (Signed)
Epidural ?Patient location during procedure: OB ?Start time: 05/20/2021 8:36 PM ?End time: 05/20/2021 8:44 PM ? ?Staffing ?Anesthesiologist: Mal Amabile, MD ?Performed: anesthesiologist  ? ?Preanesthetic Checklist ?Completed: patient identified, IV checked, site marked, risks and benefits discussed, surgical consent, monitors and equipment checked, pre-op evaluation and timeout performed ? ?Epidural ?Patient position: sitting ?Prep: DuraPrep and site prepped and draped ?Patient monitoring: continuous pulse ox and blood pressure ?Approach: midline ?Location: L3-L4 ?Injection technique: LOR air ? ?Needle:  ?Needle type: Tuohy  ?Needle gauge: 17 G ?Needle length: 9 cm and 9 ?Needle insertion depth: 5 cm ?Catheter type: closed end flexible ?Catheter size: 19 Gauge ?Catheter at skin depth: 10 cm ?Test dose: negative and Other ? ?Assessment ?Events: blood not aspirated, injection not painful, no injection resistance, no paresthesia and negative IV test ? ?Additional Notes ?Patient identified. Risks and benefits discussed including failed block, incomplete  ?Pain control, post dural puncture headache, nerve damage, paralysis, blood pressure ?Changes, nausea, vomiting, reactions to medications-both toxic and allergic and post ?Partum back pain. All questions were answered. Patient expressed understanding and wished to proceed. Sterile technique was used throughout procedure. Epidural site was ?Dressed with sterile barrier dressing. No paresthesias, signs of intravascular injection ?Or signs of intrathecal spread were encountered.  ?Patient was more comfortable after the epidural was dosed. ?Please see RN's note for documentation of vital signs and FHR which are stable. ?Reason for block:procedure for pain ? ? ? ?

## 2021-05-20 NOTE — MAU Note (Signed)
...  Natalie Stewart is a 36 y.o. at [redacted]w[redacted]d here in MAU reporting: Water broke one hour ago. Patient states occasional CTX started around 20 minutes ago and she rates them a 5/10. Clear/pink tinged fluids with "tiny" blood clots. +FM. GDM.  ? ?Pain score: 5/10 lower abdomen ? ?Lab orders placed from triage:  MAU Labor Eval ? ?

## 2021-05-20 NOTE — Anesthesia Preprocedure Evaluation (Signed)
Anesthesia Evaluation  ?Patient identified by MRN, date of birth, ID band ?Patient awake ? ? ? ?Reviewed: ?Allergy & Precautions, Patient's Chart, lab work & pertinent test results ? ?Airway ?Mallampati: II ? ?TM Distance: >3 FB ?Neck ROM: Full ? ? ? Dental ?no notable dental hx. ?(+) Teeth Intact ?  ?Pulmonary ?shortness of breath and with exertion,  ?  ?Pulmonary exam normal ?breath sounds clear to auscultation ? ? ? ? ? ? Cardiovascular ?Normal cardiovascular exam ?Rhythm:Regular Rate:Normal ? ? ?  ?Neuro/Psych ?Anxiety negative neurological ROS ?   ? GI/Hepatic ?Neg liver ROS, GERD  Medicated and Controlled,  ?Endo/Other  ?diabetes, Well Controlled, Gestational ? Renal/GU ?negative Renal ROS  ?negative genitourinary ?  ?Musculoskeletal ?negative musculoskeletal ROS ?(+)  ? Abdominal ?  ?Peds ? Hematology ? ?(+) Blood dyscrasia, anemia ,   ?Anesthesia Other Findings ? ? Reproductive/Obstetrics ?(+) Pregnancy ? ?  ? ? ? ? ? ? ? ? ? ? ? ? ? ?  ?  ? ? ? ? ? ? ? ? ?Anesthesia Physical ?Anesthesia Plan ? ?ASA: 2 ? ?Anesthesia Plan: Epidural  ? ?Post-op Pain Management:   ? ?Induction:  ? ?PONV Risk Score and Plan:  ? ?Airway Management Planned: Natural Airway ? ?Additional Equipment:  ? ?Intra-op Plan:  ? ?Post-operative Plan:  ? ?Informed Consent: I have reviewed the patients History and Physical, chart, labs and discussed the procedure including the risks, benefits and alternatives for the proposed anesthesia with the patient or authorized representative who has indicated his/her understanding and acceptance.  ? ? ? ? ? ?Plan Discussed with: Anesthesiologist ? ?Anesthesia Plan Comments:   ? ? ? ? ? ? ?Anesthesia Quick Evaluation ? ?

## 2021-05-20 NOTE — Telephone Encounter (Signed)
Preadmission screen  

## 2021-05-21 ENCOUNTER — Encounter (HOSPITAL_COMMUNITY): Payer: Self-pay | Admitting: Obstetrics and Gynecology

## 2021-05-21 LAB — CBC
HCT: 30.5 % — ABNORMAL LOW (ref 36.0–46.0)
Hemoglobin: 10.6 g/dL — ABNORMAL LOW (ref 12.0–15.0)
MCH: 30.5 pg (ref 26.0–34.0)
MCHC: 34.8 g/dL (ref 30.0–36.0)
MCV: 87.6 fL (ref 80.0–100.0)
Platelets: 139 10*3/uL — ABNORMAL LOW (ref 150–400)
RBC: 3.48 MIL/uL — ABNORMAL LOW (ref 3.87–5.11)
RDW: 13.4 % (ref 11.5–15.5)
WBC: 19.4 10*3/uL — ABNORMAL HIGH (ref 4.0–10.5)
nRBC: 0 % (ref 0.0–0.2)

## 2021-05-21 LAB — GLUCOSE, CAPILLARY: Glucose-Capillary: 114 mg/dL — ABNORMAL HIGH (ref 70–99)

## 2021-05-21 LAB — RPR: RPR Ser Ql: NONREACTIVE

## 2021-05-21 MED ORDER — TRANEXAMIC ACID-NACL 1000-0.7 MG/100ML-% IV SOLN
INTRAVENOUS | Status: AC
  Administered 2021-05-21: 1000 mg
  Filled 2021-05-21: qty 100

## 2021-05-21 MED ORDER — METHYLERGONOVINE MALEATE 0.2 MG PO TABS: 0.2000 mg | ORAL_TABLET | ORAL | Status: DC | PRN

## 2021-05-21 MED ORDER — CARBOPROST TROMETHAMINE 250 MCG/ML IM SOLN: 250.0000 ug | Freq: Once | INTRAMUSCULAR | Status: DC

## 2021-05-21 MED ORDER — OXYCODONE HCL 5 MG PO TABS: 5.0000 mg | ORAL_TABLET | ORAL | Status: DC | PRN

## 2021-05-21 MED ORDER — ONDANSETRON HCL 4 MG PO TABS: 4.0000 mg | ORAL_TABLET | ORAL | Status: DC | PRN

## 2021-05-21 MED ORDER — MISOPROSTOL 200 MCG PO TABS
1000.0000 ug | ORAL_TABLET | Freq: Once | ORAL | Status: AC
  Administered 2021-05-21: 1000 ug via RECTAL

## 2021-05-21 MED ORDER — TETANUS-DIPHTH-ACELL PERTUSSIS 5-2.5-18.5 LF-MCG/0.5 IM SUSY: 0.5000 mL | PREFILLED_SYRINGE | Freq: Once | INTRAMUSCULAR | Status: DC

## 2021-05-21 MED ORDER — TRANEXAMIC ACID-NACL 1000-0.7 MG/100ML-% IV SOLN: 1000.0000 mg | INTRAVENOUS | Status: AC

## 2021-05-21 MED ORDER — CARBOPROST TROMETHAMINE 250 MCG/ML IM SOLN
INTRAMUSCULAR | Status: AC
  Administered 2021-05-21: 250 ug
  Filled 2021-05-21: qty 1

## 2021-05-21 MED ORDER — COCONUT OIL OIL: 1.0000 "application " | TOPICAL_OIL | Status: DC | PRN

## 2021-05-21 MED ORDER — ONDANSETRON HCL 4 MG/2ML IJ SOLN: 4.0000 mg | INTRAMUSCULAR | Status: DC | PRN

## 2021-05-21 MED ORDER — ACETAMINOPHEN 325 MG PO TABS: 650.0000 mg | ORAL_TABLET | ORAL | Status: DC | PRN

## 2021-05-21 MED ORDER — DIPHENHYDRAMINE HCL 25 MG PO CAPS
25.0000 mg | ORAL_CAPSULE | Freq: Four times a day (QID) | ORAL | Status: DC | PRN
Start: 2021-05-21 — End: 2021-05-22

## 2021-05-21 MED ORDER — PRENATAL MULTIVITAMIN CH
1.0000 | ORAL_TABLET | Freq: Every day | ORAL | Status: DC
  Administered 2021-05-21 – 2021-05-22 (×2): 1 via ORAL
  Filled 2021-05-21 (×3): qty 1

## 2021-05-21 MED ORDER — WITCH HAZEL-GLYCERIN EX PADS: 1.0000 "application " | MEDICATED_PAD | CUTANEOUS | Status: DC | PRN

## 2021-05-21 MED ORDER — FAMOTIDINE 20 MG PO TABS
20.0000 mg | ORAL_TABLET | Freq: Two times a day (BID) | ORAL | Status: DC
  Administered 2021-05-21: 20 mg via ORAL
  Filled 2021-05-21 (×2): qty 1

## 2021-05-21 MED ORDER — ZOLPIDEM TARTRATE 5 MG PO TABS: 5.0000 mg | ORAL_TABLET | Freq: Every evening | ORAL | Status: DC | PRN

## 2021-05-21 MED ORDER — METHYLERGONOVINE MALEATE 0.2 MG/ML IJ SOLN: 0.2000 mg | INTRAMUSCULAR | Status: DC | PRN

## 2021-05-21 MED ORDER — IBUPROFEN 600 MG PO TABS
600.0000 mg | ORAL_TABLET | Freq: Four times a day (QID) | ORAL | Status: DC
  Administered 2021-05-21 – 2021-05-22 (×6): 600 mg via ORAL
  Filled 2021-05-21 (×6): qty 1

## 2021-05-21 MED ORDER — METHYLERGONOVINE MALEATE 0.2 MG/ML IJ SOLN
INTRAMUSCULAR | Status: AC
  Administered 2021-05-21: 0.2 mg
  Filled 2021-05-21: qty 1

## 2021-05-21 MED ORDER — DIBUCAINE (PERIANAL) 1 % EX OINT
1.0000 "application " | TOPICAL_OINTMENT | CUTANEOUS | Status: DC | PRN
  Filled 2021-05-21: qty 28

## 2021-05-21 MED ORDER — METHYLERGONOVINE MALEATE 0.2 MG/ML IJ SOLN: 0.2000 mg | Freq: Once | INTRAMUSCULAR | Status: DC

## 2021-05-21 MED ORDER — MISOPROSTOL 200 MCG PO TABS
ORAL_TABLET | ORAL | Status: AC
  Filled 2021-05-21: qty 5

## 2021-05-21 MED ORDER — BENZOCAINE-MENTHOL 20-0.5 % EX AERO
1.0000 "application " | INHALATION_SPRAY | CUTANEOUS | Status: DC | PRN
  Filled 2021-05-21: qty 56

## 2021-05-21 MED ORDER — SIMETHICONE 80 MG PO CHEW: 80.0000 mg | CHEWABLE_TABLET | ORAL | Status: DC | PRN

## 2021-05-21 MED ORDER — DIPHENOXYLATE-ATROPINE 2.5-0.025 MG PO TABS
2.0000 | ORAL_TABLET | Freq: Four times a day (QID) | ORAL | Status: DC | PRN
  Administered 2021-05-21: 2 via ORAL
  Filled 2021-05-21: qty 2

## 2021-05-21 MED ORDER — OXYCODONE HCL 5 MG PO TABS: 10.0000 mg | ORAL_TABLET | ORAL | Status: DC | PRN

## 2021-05-21 MED ORDER — SENNOSIDES-DOCUSATE SODIUM 8.6-50 MG PO TABS
2.0000 | ORAL_TABLET | ORAL | Status: DC
  Administered 2021-05-21 – 2021-05-22 (×2): 2 via ORAL
  Filled 2021-05-21 (×2): qty 2

## 2021-05-21 MED ORDER — SERTRALINE HCL 50 MG PO TABS
50.0000 mg | ORAL_TABLET | Freq: Every day | ORAL | Status: DC
  Administered 2021-05-21: 50 mg via ORAL
  Filled 2021-05-21 (×2): qty 1

## 2021-05-21 NOTE — Progress Notes (Signed)
Post Partum Day 0 ?Subjective: ?no complaints and voiding ? ?Objective: ?Blood pressure 121/82, pulse 86, temperature 99 ?F (37.2 ?C), temperature source Oral, resp. rate 18, height 5\' 3"  (1.6 m), weight 71.4 kg, last menstrual period 08/23/2020, SpO2 99 %, unknown if currently breastfeeding. ? ?Physical Exam:  ?General: alert, cooperative, and no distress ?Lochia: appropriate ?Uterine Fundus: firm ?Incision: healing well ?DVT Evaluation: No evidence of DVT seen on physical exam. ? ?Recent Labs  ?  05/20/21 ?1356 05/21/21 ?0442  ?HGB 11.4* 10.6*  ?HCT 33.7* 30.5*  ? ? ?Assessment/Plan: ?Plan for discharge tomorrow ? ? LOS: 1 day  ? ?05/23/21 II ?05/21/2021, 7:00 AM  ? ? ?

## 2021-05-21 NOTE — Lactation Note (Signed)
This note was copied from a baby's chart. ?Lactation Consultation Note ? ?Patient Name: Natalie Stewart ?Today's Date: 05/21/2021 ?Reason for consult: Initial assessment;Early term 37-38.6wks;Primapara;1st time breastfeeding ?Age:35 hours ? ? ?P1 mother whose infant is now 42 hours old.  This is an ETI at 38+5 weeks.  Mother's current feeding preference is breast. ? ?Baby "Rosalyn Gess" was beginning to arouse in bassinet.  Mother fed almost three hours ago.  Offered to assist with latching; mother receptive. ? ?Taught hand expression; finger fed a few colostrum drops to "[place]."  Attempted to latch in the football hold, however, he was too sleepy; pushed back and became fussy.  Placed him STS on mother's chest and he calmed.  Mother mentioned that she brought EBM to the hospital and I suggested we supplement with her EBM after breast feeding sessions.  Observed mother's total volume to be 55 mls; praised mother for the breast milk.  Demonstrated curved tip syringe and baby consumed 5 mls; burped and placed back on mother's chest STS where he fell asleep.  Discussed using the curved tip syringe in the first 24 hours.  Parents appreciative of help given. Provided and explained the volume supplementation guidelines. ? ?Encouraged to call her RN/LC for latch assistance as needed.  RN updated. ? ? ? ?Maternal Data ?Has patient been taught Hand Expression?: Yes ?Does the patient have breastfeeding experience prior to this delivery?: No ? ?Feeding ?Mother's Current Feeding Choice: Breast Milk ? ?LATCH Score ?Latch: Too sleepy or reluctant, no latch achieved, no sucking elicited. ? ?Audible Swallowing: None ? ?Type of Nipple: Everted at rest and after stimulation (Short shafted) ? ?Comfort (Breast/Nipple): Soft / non-tender ? ?Hold (Positioning): Assistance needed to correctly position infant at breast and maintain latch. ? ?LATCH Score: 5 ? ? ?Lactation Tools Discussed/Used ?  ? ?Interventions ?Interventions: Breast feeding  basics reviewed;Assisted with latch;Skin to skin;Breast massage;Hand express;Breast compression;Expressed milk;Position options;Support pillows;Adjust position;Education;LC Services brochure ? ?Discharge ?Pump: Personal (Spectra) ?WIC Program: No ? ?Consult Status ?Consult Status: Follow-up ?Date: 05/22/21 ?Follow-up type: In-patient ? ? ? ?Burnett Lieber R Connelly [place] ?05/21/2021, 8:49 AM ? ? ? ?

## 2021-05-21 NOTE — Progress Notes (Signed)
MOB was referred for history of depression/anxiety. ?* Referral screened out by Clinical Social Worker because none of the following criteria appear to apply: ?~ History of anxiety/depression during this pregnancy, or of post-partum depression. ?~ Diagnosis of anxiety and/or depression within last 3 years ?OR ?* MOB's symptoms currently being treated with medication and/or therapy. MOB has an active Rx for Zoloft.  No concerns were noted in MOB's [facility] records.  ? ?MOB's Edinburgh Score is 3. ? ?Please contact the Clinical Social Worker if needs arise, or if MOB requests. ?  ?Blaine Hamper, MSW, LCSW ?Clinical Social Work ?([phone] ? ?

## 2021-05-21 NOTE — Lactation Note (Signed)
This note was copied from a baby's chart. ?Lactation Consultation Note ?Assisted baby in cradle position for latching. ?Took a couple of times but baby finally latched. Popped off and on a few times then suckled well. ?Mom is compressible and baby was able to obtain a deep latch. Mom denies painful latch. ?Good bonding time noted. ?Mom will be f/u on MBU today. ? ?Patient Name: Natalie Stewart ?Today's Date: 05/21/2021 ?Reason for consult: L&D Initial assessment;Maternal endocrine disorder ?Age:47 hours ? ?Maternal Data ?  ? ?Feeding ?  ? ?LATCH Score ?Latch: Grasps breast easily, tongue down, lips flanged, rhythmical sucking. ? ?Audible Swallowing: None ? ?Type of Nipple: Flat (semi flat, compressible) ? ?Comfort (Breast/Nipple): Soft / non-tender ? ?Hold (Positioning): Assistance needed to correctly position infant at breast and maintain latch. ? ?LATCH Score: 6 ? ? ?Lactation Tools Discussed/Used ?  ? ?Interventions ?Interventions: Assisted with latch;Skin to skin;Adjust position;Support pillows ? ?Discharge ?  ? ?Consult Status ?Consult Status: Follow-up from L&D ?Date: 05/21/21 ?Follow-up type: In-patient ? ? ? ?Charyl Dancer ?05/21/2021, 2:34 AM ? ? ? ?

## 2021-05-21 NOTE — Progress Notes (Signed)
C/c/+2 push 

## 2021-05-21 NOTE — Anesthesia Postprocedure Evaluation (Signed)
Anesthesia Post Note ? ?Patient: Natalie Stewart ? ?Procedure(s) Performed: AN AD HOC LABOR EPIDURAL ? ?  ? ?Patient location during evaluation: Mother Baby ?Anesthesia Type: Epidural ?Level of consciousness: awake ?Pain management: satisfactory to patient ?Vital Signs Assessment: post-procedure vital signs reviewed and stable ?Respiratory status: spontaneous breathing ?Cardiovascular status: stable ?Anesthetic complications: no ? ? ?No notable events documented. ? ?Last Vitals:  ?Vitals:  ? 05/21/21 0536 05/21/21 0906  ?BP: 121/82 118/72  ?Pulse: 86 85  ?Resp: 18 18  ?Temp:    ?SpO2: 99% 98%  ?  ?Last Pain:  ?Vitals:  ? 05/21/21 0906  ?TempSrc: Oral  ?PainSc:   ? ?Pain Goal:   ? ?  ?  ?  ?  ?  ?  ?Epidural/Spinal Function Cutaneous sensation: Normal sensation (05/21/21 0908) ? ?Daruis Swaim ? ? ? ? ?

## 2021-05-22 MED ORDER — IBUPROFEN 600 MG PO TABS
600.0000 mg | ORAL_TABLET | Freq: Four times a day (QID) | ORAL | 0 refills | Status: DC | PRN
  Filled 2021-05-22: qty 60, 15d supply, fill #0

## 2021-05-22 MED ORDER — ACETAMINOPHEN 325 MG PO TABS
650.0000 mg | ORAL_TABLET | Freq: Four times a day (QID) | ORAL | 0 refills | Status: DC | PRN
Start: 2021-05-22 — End: 2022-10-03
  Filled 2021-05-22: qty 60, 8d supply, fill #0

## 2021-05-22 NOTE — Progress Notes (Signed)
Post Partum Day 1 ?Subjective: ?no complaints, up ad lib, voiding, tolerating PO, and + flatus ? ?Objective: ?Blood pressure 116/78, pulse 82, temperature 98.6 ?F (37 ?C), temperature source Oral, resp. rate 16, height 5\' 3"  (1.6 m), weight 71.4 kg, last menstrual period 08/23/2020, SpO2 99 %, unknown if currently breastfeeding. ? ?Physical Exam:  ?General: alert, cooperative, and no distress ?Lochia: appropriate ?Uterine Fundus: firm ?Incision: healing well ?DVT Evaluation: No evidence of DVT seen on physical exam. ? ?Recent Labs  ?  05/20/21 ?1356 05/21/21 ?0442  ?HGB 11.4* 10.6*  ?HCT 33.7* 30.5*  ? ? ?Assessment/Plan: ?D/W discharge instructions ?Will discharge if baby is ready ?D/W circumcision of newborn boy baby. Risks and procedure reviewed. She states she understands and agrees. ? ? LOS: 2 days  ? ?05/23/21 II ?05/22/2021, 7:34 AM  ? ? ?

## 2021-05-22 NOTE — Lactation Note (Signed)
This note was copied from a baby's chart. ?Lactation Consultation Note ? ?Patient Name: Natalie Stewart ?Today's Date: 05/22/2021 ?Reason for consult: Follow-up assessment;Primapara;1st time breastfeeding;Early term 37-38.6wks;Infant weight loss;Maternal endocrine disorder ?Age:35 hours ?2nd [place] visit - post circ . Per mom baby had fed 5 mins before LC came in the room and fell asleep.  ?LC recommended for [place] to wake him up again to check a latch.  ?Baby awake STS and latched for 13 mins with increased swallows with breast compressions. Baby noted to be non - nutritive and LC instructed mom how to release the baby off the breast . Nipple slightly slanted.  ?LC has already given mom shells as part of the [facility] plan, see previous note.  ? ?Maternal Data ?Has patient been taught Hand Expression?: Yes ?Does the patient have breastfeeding experience prior to this delivery?: No ? ?Feeding ?Mother's Current Feeding Choice: Breast Milk ? ?LATCH Score ?Latch: Grasps breast easily, tongue down, lips flanged, rhythmical sucking. ? ?Audible Swallowing: Spontaneous and intermittent ? ?Type of Nipple: Everted at rest and after stimulation ? ?Comfort (Breast/Nipple): Soft / non-tender ? ?Hold (Positioning): Assistance needed to correctly position infant at breast and maintain latch. ? ?LATCH Score: 9 ? ? ?Lactation Tools Discussed/Used ?Tools: Shells ? ?Interventions ?Interventions: Breast feeding basics reviewed;Assisted with latch;Skin to skin;Breast massage;Hand express;Breast compression;Adjust position;Support pillows;Position options;Shells;Education;LC Services brochure ? ?Discharge ?Discharge Education: Engorgement and breast care;Warning signs for feeding baby ?Pump: DEBP;Manual;Personal ? ?Consult Status ?Consult Status: Complete ?Date: 05/22/21 ?Follow-up type: In-patient ? ? ? ?Jerlyn Ly Pailyn [place] ?05/22/2021, 12:01 PM ? ? ? ?

## 2021-05-22 NOTE — Lactation Note (Signed)
This note was copied from a baby's chart. ?Lactation Consultation Note ? ?Patient Name: Natalie Stewart ?Today's Date: 05/22/2021 ?Reason for consult: Maternal endocrine disorder;Other (Comment);Follow-up assessment;Primapara;1st time breastfeeding;Early term 37-38.6wks;Infant weight loss (LC encouraged mom to call for Feeding assessment and Latch score.) ?Age:35 hours ?P1 , when LC entered the room baby was in the tx nursery for circ and returned sound asleep. Mom and [name] are a early D/C today.  ?LC reviewed and updated the doc flow sheets. The only 2 latch scores are 6-5 no swallows.  ?Per mom has been hearing swallows when baby  had been feeding. Initially some discomfort and then improves. LC offered to assess breast tissue , mom receptive.  ?Noted the right nipple to have intact abrasion on the face portion of the nipple , left nipple clear. Semi compressible areolas both breast.  ?LC reviewed steps for latching to prevent any further soreness and breakdown.  ?Shells while awake between feedings  ?Prior to latch breast massage, hand express, prepump with hand pump if needed  ?And reverse pressure ( stretch back ) exercise. Latch with firm support and breast compressions until the baby is in a good feeding pattern.  ?LC reviewed BF D/C teaching and mom aware of the [facility] resources after D/C.  ? ?LC encouraged to call for Latch assessment and nurse aware  of the need.  ?Early D/C.  ? ?Maternal Data ?Has patient been taught Hand Expression?: Yes ?Does the patient have breastfeeding experience prior to this delivery?: No ? ?Feeding ?Mother's Current Feeding Choice: Breast Milk ? ?LATCH Score ?  ? ?  ? ?  ? ?  ? ?  ? ?  ? ? ?Lactation Tools Discussed/Used ?  ? ?Interventions ?Interventions: Breast feeding basics reviewed;Shells;Education;LC Services brochure ? ?Discharge ?Pump: DEBP;Manual ? ?Consult Status ?Consult Status: Follow-up ?Date: 05/22/21 ?Follow-up type: In-patient ? ? ? ?Matilde Sprang Astrid Vides ?05/22/2021, 9:59  AM ? ? ? ?

## 2021-05-22 NOTE — Discharge Summary (Signed)
? ?  Postpartum Discharge Summary ? ?Date of Service updated 05/22/21 ? ?   ?Patient Name: Natalie Stewart ?DOB: 1985/04/11 ?MRN: [id number] ? ?Date of admission: 05/20/2021 ?Delivery date:05/21/2021  ?Delivering provider: Lucillie Garfinkel JENNIFER  ?Date of discharge: 05/22/2021 ? ?Admitting diagnosis: Pregnancy [Z34.90] ?Intrauterine pregnancy: [redacted]w[redacted]d    ?Secondary diagnosis:  Principal Problem: ?  Pregnancy ? ?Additional problems:     ?Discharge diagnosis: Term Pregnancy Delivered                                              ?Post partum procedures: ?Augmentation: Pitocin ?Complications: None ? ?Hospital course: Onset of Labor With Vaginal Delivery      ?36y.o. yo G1P1001 at 345w5das admitted in Active Labor on 05/20/2021. Patient had an uncomplicated labor course as follows:  ?Membrane Rupture Time/Date: 12:30 PM ,05/20/2021   ?Delivery Method:Vaginal, Spontaneous  ?Episiotomy: None  ?Lacerations:  2nd degree  ?Patient had an uncomplicated postpartum course.  She is ambulating, tolerating a regular diet, passing flatus, and urinating well. Patient is discharged home in stable condition on 05/22/21. ? ?Newborn Data: ?Birth date:05/21/2021  ?Birth time:1:48 AM  ?Gender:Female  ?Living status:Living  ?Apgars:8 ,9  ?Weight:3580 g  ? ?Magnesium Sulfate received: No ?BMZ received: No ?Rhophylac:No ?MMR:No ?T-DaP:Given prenatally ?Flu: No ?Transfusion:No ? ?Physical exam  ?Vitals:  ? 05/21/21 1318 05/21/21 1707 05/21/21 1955 05/22/21 0500  ?BP: 106/66 113/75 111/75 116/78  ?Pulse: 83 78 86 82  ?Resp: _0 ?Temp: 98.4 ?F (36.9 ?C) 98.4 ?F (36.9 ?C) 98.3 ?F (36.8 ?C) 98.6 ?F (37 ?C)  ?TempSrc: Oral Oral Oral Oral  ?SpO2: 98%  99% 99%  ?Weight:      ?Height:      ? ?General: alert, cooperative, and no distress ?Lochia: appropriate ?Uterine Fundus: firm ?Incision: Healing well with no significant drainage ?DVT Evaluation: No evidence of DVT seen on physical exam. ?Labs: ?Lab Results  ?Component Value Date  ? WBC 19.4 (H) 05/21/2021   ? HGB 10.6 (L) 05/21/2021  ? HCT 30.5 (L) 05/21/2021  ? MCV 87.6 05/21/2021  ? PLT 139 (L) 05/21/2021  ? ?CMP Latest Ref Rng & Units 09/23/2020  ?Glucose 70 - 99 mg/dL 103(H)  ?BUN 6 - 20 mg/dL 12  ?Creatinine 0.44 - 1.00 mg/dL 0.82  ?Sodium 135 - 145 mmol/L 137  ?Potassium 3.5 - 5.1 mmol/L 3.8  ?Chloride 98 - 111 mmol/L 103  ?CO2 22 - 32 mmol/L 24  ?Calcium 8.9 - 10.3 mg/dL 8.8(L)  ?Total Protein 6.5 - 8.1 g/dL 7.3  ?Total Bilirubin 0.3 - 1.2 mg/dL 0.8  ?Alkaline Phos 38 - 126 U/L 57  ?AST 15 - 41 U/L 19  ?ALT 0 - 44 U/L 14  ? ?Edinburgh Score: ?Edinburgh Postnatal Depression Scale Screening Tool 05/21/2021  ?I have been able to laugh and see the funny side of things. 0  ?I have looked forward with enjoyment to things. 0  ?I have blamed myself unnecessarily when things went wrong. 0  ?I have been anxious or worried for no good reason. 0  ?I have felt scared or panicky for no good reason. 1  ?Things have been getting on top of me. 0  ?I have been so unhappy that I have had difficulty sleeping. 0  ?I have felt sad or miserable. 1  ?I have been so unhappy that  I have been crying. 1  ?The thought of harming myself has occurred to me. 0  ?Edinburgh Postnatal Depression Scale Total 3  ? ? ? ? ?After visit meds:  ?Allergies as of 05/22/2021   ? ?   Reactions  ? Latex Rash  ? ?  ? ?  ?Medication List  ?  ? ?STOP taking these medications   ? ?famotidine 20 MG tablet ?Commonly known as: PEPCID ?  ?freestyle lancets ?  ?FREESTYLE LITE test strip ?Generic drug: glucose blood ?  ?FreeStyle Lite w/Device Kit ?  ? ?  ? ?TAKE these medications   ? ?acetaminophen 325 MG tablet ?Commonly known as: Tylenol ?Take 2 tablets (650 mg total) by mouth every 6 (six) hours as needed (for pain scale < 4). ?  ?ibuprofen 600 MG tablet ?Commonly known as: ADVIL ?Take 1 tablet (600 mg total) by mouth every 6 (six) hours as needed. ?  ?prenatal multivitamin Tabs tablet ?Take 1 tablet by mouth daily at 12 noon. ?  ?sertraline 50 MG tablet ?Commonly  known as: ZOLOFT ?Take 1 tablet (50 mg total) by mouth daily. ?  ? ?  ? ? ? ?Discharge home in stable condition ?Infant Feeding: Breast ?Infant Disposition:home with mother ?Discharge instruction: per After Visit Summary and Postpartum booklet. ?Activity: Advance as tolerated. Pelvic rest for 6 weeks.  ?Diet: routine diet ?Anticipated Birth Control: Unsure ?Postpartum Appointment:6 weeks ?Additional Postpartum F/U:  ?Future Appointments:No future appointments. ?Follow up Visit: ? ? ?  ? ?05/22/2021 ?Shon Millet II, MD ? ? ?

## 2021-05-24 ENCOUNTER — Other Ambulatory Visit (HOSPITAL_COMMUNITY): Payer: Self-pay

## 2021-05-25 ENCOUNTER — Inpatient Hospital Stay (HOSPITAL_COMMUNITY): Admission: AD | Admit: 2021-05-25 | Payer: 59 | Source: Home / Self Care | Admitting: Obstetrics and Gynecology

## 2021-05-25 ENCOUNTER — Inpatient Hospital Stay (HOSPITAL_COMMUNITY): Payer: 59

## 2021-06-01 ENCOUNTER — Telehealth (HOSPITAL_COMMUNITY): Payer: Self-pay | Admitting: *Deleted

## 2021-06-01 ENCOUNTER — Other Ambulatory Visit (HOSPITAL_COMMUNITY): Payer: Self-pay

## 2021-06-01 NOTE — Telephone Encounter (Signed)
Mom reports feeling good. No concerns about herself at this time. EPDS= 3(Hospital score=3) ?Mom reports baby is doing well. Feeding, peeing, and pooping without difficulty. Safe sleep reviewed. Mom reports no concerns about baby at present. ? ?Duffy Rhody, RN 06-01-2021 at 10:05am ?

## 2021-06-04 ENCOUNTER — Other Ambulatory Visit (HOSPITAL_COMMUNITY): Payer: Self-pay

## 2021-06-28 ENCOUNTER — Other Ambulatory Visit (HOSPITAL_COMMUNITY): Payer: Self-pay

## 2021-06-28 DIAGNOSIS — Z124 Encounter for screening for malignant neoplasm of cervix: Secondary | ICD-10-CM | POA: Diagnosis not present

## 2021-06-28 DIAGNOSIS — Z3040 Encounter for surveillance of contraceptives, unspecified: Secondary | ICD-10-CM | POA: Diagnosis not present

## 2021-06-28 DIAGNOSIS — Z1389 Encounter for screening for other disorder: Secondary | ICD-10-CM | POA: Diagnosis not present

## 2021-06-28 DIAGNOSIS — Z1151 Encounter for screening for human papillomavirus (HPV): Secondary | ICD-10-CM | POA: Diagnosis not present

## 2021-06-28 DIAGNOSIS — Z01419 Encounter for gynecological examination (general) (routine) without abnormal findings: Secondary | ICD-10-CM | POA: Diagnosis not present

## 2021-06-28 MED ORDER — NORETHINDRONE 0.35 MG PO TABS
1.0000 | ORAL_TABLET | Freq: Every day | ORAL | 4 refills | Status: DC
  Filled 2021-06-28 – 2021-07-08 (×2): qty 84, 84d supply, fill #0

## 2021-07-06 ENCOUNTER — Other Ambulatory Visit (HOSPITAL_COMMUNITY): Payer: Self-pay

## 2021-07-08 ENCOUNTER — Other Ambulatory Visit (HOSPITAL_COMMUNITY): Payer: Self-pay

## 2021-08-10 ENCOUNTER — Other Ambulatory Visit: Payer: Self-pay | Admitting: Medical-Surgical

## 2021-08-10 ENCOUNTER — Other Ambulatory Visit (HOSPITAL_COMMUNITY): Payer: Self-pay

## 2021-08-10 DIAGNOSIS — F419 Anxiety disorder, unspecified: Secondary | ICD-10-CM

## 2021-08-11 ENCOUNTER — Other Ambulatory Visit (HOSPITAL_COMMUNITY): Payer: Self-pay

## 2021-08-11 ENCOUNTER — Other Ambulatory Visit: Payer: Self-pay | Admitting: Medical-Surgical

## 2021-08-11 DIAGNOSIS — F419 Anxiety disorder, unspecified: Secondary | ICD-10-CM

## 2021-08-12 ENCOUNTER — Other Ambulatory Visit (HOSPITAL_COMMUNITY): Payer: Self-pay

## 2021-08-12 MED ORDER — SERTRALINE HCL 50 MG PO TABS
50.0000 mg | ORAL_TABLET | Freq: Every day | ORAL | 0 refills | Status: DC
  Filled 2021-08-12: qty 30, 30d supply, fill #0

## 2021-10-16 ENCOUNTER — Other Ambulatory Visit: Payer: Self-pay | Admitting: Medical-Surgical

## 2021-10-16 DIAGNOSIS — F419 Anxiety disorder, unspecified: Secondary | ICD-10-CM

## 2021-10-18 ENCOUNTER — Other Ambulatory Visit (HOSPITAL_COMMUNITY): Payer: Self-pay

## 2021-10-18 MED ORDER — SERTRALINE HCL 50 MG PO TABS
50.0000 mg | ORAL_TABLET | Freq: Every day | ORAL | 0 refills | Status: DC
  Filled 2021-10-18: qty 30, 30d supply, fill #0

## 2021-10-27 DIAGNOSIS — M25462 Effusion, left knee: Secondary | ICD-10-CM | POA: Diagnosis not present

## 2021-10-27 DIAGNOSIS — M25562 Pain in left knee: Secondary | ICD-10-CM | POA: Diagnosis not present

## 2021-10-29 ENCOUNTER — Other Ambulatory Visit: Payer: Self-pay | Admitting: Orthopedic Surgery

## 2021-10-29 DIAGNOSIS — M25562 Pain in left knee: Secondary | ICD-10-CM

## 2021-10-30 ENCOUNTER — Ambulatory Visit
Admission: RE | Admit: 2021-10-30 | Discharge: 2021-10-30 | Disposition: A | Discharge status: Home / Self Care | Payer: 59 | Source: Ambulatory Visit | Attending: Orthopedic Surgery | Admitting: Orthopedic Surgery

## 2021-10-30 DIAGNOSIS — M25562 Pain in left knee: Secondary | ICD-10-CM

## 2021-10-30 DIAGNOSIS — S72435A Nondisplaced fracture of medial condyle of left femur, initial encounter for closed fracture: Secondary | ICD-10-CM | POA: Diagnosis not present

## 2021-11-03 DIAGNOSIS — S83511A Sprain of anterior cruciate ligament of right knee, initial encounter: Secondary | ICD-10-CM | POA: Diagnosis not present

## 2021-11-05 HISTORY — PX: ANTERIOR CRUCIATE LIGAMENT REPAIR: SHX115

## 2021-11-16 ENCOUNTER — Other Ambulatory Visit (HOSPITAL_BASED_OUTPATIENT_CLINIC_OR_DEPARTMENT_OTHER): Payer: Self-pay

## 2021-11-16 DIAGNOSIS — S83262A Peripheral tear of lateral meniscus, current injury, left knee, initial encounter: Secondary | ICD-10-CM | POA: Diagnosis not present

## 2021-11-16 DIAGNOSIS — S83512A Sprain of anterior cruciate ligament of left knee, initial encounter: Secondary | ICD-10-CM | POA: Diagnosis not present

## 2021-11-16 DIAGNOSIS — G8918 Other acute postprocedural pain: Secondary | ICD-10-CM | POA: Diagnosis not present

## 2021-11-16 MED ORDER — OXYCODONE HCL 5 MG PO TABS
ORAL_TABLET | ORAL | 0 refills | Status: DC
  Filled 2021-11-16: qty 40, 7d supply, fill #0

## 2021-11-16 MED ORDER — METHOCARBAMOL 500 MG PO TABS
ORAL_TABLET | ORAL | 0 refills | Status: DC
  Filled 2021-11-16: qty 40, 5d supply, fill #0

## 2021-11-16 MED ORDER — TRAMADOL HCL 50 MG PO TABS
ORAL_TABLET | ORAL | 0 refills | Status: DC
  Filled 2021-11-16: qty 40, 10d supply, fill #0

## 2021-11-16 MED ORDER — CEPHALEXIN 500 MG PO CAPS
500.0000 mg | ORAL_CAPSULE | Freq: Four times a day (QID) | ORAL | 0 refills | Status: DC
  Filled 2021-11-16: qty 12, 3d supply, fill #0

## 2021-11-16 MED ORDER — PROMETHAZINE HCL 25 MG PO TABS
ORAL_TABLET | ORAL | 0 refills | Status: DC
  Filled 2021-11-16: qty 40, 7d supply, fill #0

## 2021-11-19 DIAGNOSIS — Z96652 Presence of left artificial knee joint: Secondary | ICD-10-CM | POA: Diagnosis not present

## 2021-11-19 DIAGNOSIS — Z4789 Encounter for other orthopedic aftercare: Secondary | ICD-10-CM | POA: Diagnosis not present

## 2021-11-19 DIAGNOSIS — M25562 Pain in left knee: Secondary | ICD-10-CM | POA: Diagnosis not present

## 2021-11-22 DIAGNOSIS — M25562 Pain in left knee: Secondary | ICD-10-CM | POA: Diagnosis not present

## 2021-11-24 DIAGNOSIS — M25562 Pain in left knee: Secondary | ICD-10-CM | POA: Diagnosis not present

## 2021-12-01 DIAGNOSIS — M25562 Pain in left knee: Secondary | ICD-10-CM | POA: Diagnosis not present

## 2021-12-01 DIAGNOSIS — Z4789 Encounter for other orthopedic aftercare: Secondary | ICD-10-CM | POA: Diagnosis not present

## 2021-12-08 DIAGNOSIS — M25562 Pain in left knee: Secondary | ICD-10-CM | POA: Diagnosis not present

## 2021-12-10 DIAGNOSIS — M25562 Pain in left knee: Secondary | ICD-10-CM | POA: Diagnosis not present

## 2021-12-15 DIAGNOSIS — M25562 Pain in left knee: Secondary | ICD-10-CM | POA: Diagnosis not present

## 2021-12-17 DIAGNOSIS — M25562 Pain in left knee: Secondary | ICD-10-CM | POA: Diagnosis not present

## 2021-12-18 ENCOUNTER — Other Ambulatory Visit: Payer: Self-pay | Admitting: Medical-Surgical

## 2021-12-18 DIAGNOSIS — F419 Anxiety disorder, unspecified: Secondary | ICD-10-CM

## 2021-12-20 ENCOUNTER — Other Ambulatory Visit (HOSPITAL_COMMUNITY): Payer: Self-pay

## 2021-12-20 DIAGNOSIS — M25562 Pain in left knee: Secondary | ICD-10-CM | POA: Diagnosis not present

## 2021-12-20 MED ORDER — SERTRALINE HCL 50 MG PO TABS
50.0000 mg | ORAL_TABLET | Freq: Every day | ORAL | 0 refills | Status: DC
  Filled 2021-12-20: qty 15, 15d supply, fill #0

## 2021-12-23 DIAGNOSIS — M25562 Pain in left knee: Secondary | ICD-10-CM | POA: Diagnosis not present

## 2021-12-28 DIAGNOSIS — M25562 Pain in left knee: Secondary | ICD-10-CM | POA: Diagnosis not present

## 2021-12-30 ENCOUNTER — Encounter: Payer: Self-pay | Admitting: Medical-Surgical

## 2021-12-30 ENCOUNTER — Ambulatory Visit (INDEPENDENT_AMBULATORY_CARE_PROVIDER_SITE_OTHER): Payer: 59 | Admitting: Medical-Surgical

## 2021-12-30 ENCOUNTER — Other Ambulatory Visit (HOSPITAL_COMMUNITY): Payer: Self-pay

## 2021-12-30 VITALS — BP 109/70 | HR 91 | Resp 20 | Ht 63.0 in | Wt 128.1 lb

## 2021-12-30 DIAGNOSIS — O24419 Gestational diabetes mellitus in pregnancy, unspecified control: Secondary | ICD-10-CM | POA: Insufficient documentation

## 2021-12-30 DIAGNOSIS — Z0000 Encounter for general adult medical examination without abnormal findings: Secondary | ICD-10-CM

## 2021-12-30 DIAGNOSIS — F419 Anxiety disorder, unspecified: Secondary | ICD-10-CM | POA: Diagnosis not present

## 2021-12-30 DIAGNOSIS — L700 Acne vulgaris: Secondary | ICD-10-CM | POA: Insufficient documentation

## 2021-12-30 DIAGNOSIS — Z8632 Personal history of gestational diabetes: Secondary | ICD-10-CM

## 2021-12-30 DIAGNOSIS — M25562 Pain in left knee: Secondary | ICD-10-CM | POA: Diagnosis not present

## 2021-12-30 DIAGNOSIS — Z23 Encounter for immunization: Secondary | ICD-10-CM | POA: Diagnosis not present

## 2021-12-30 DIAGNOSIS — L309 Dermatitis, unspecified: Secondary | ICD-10-CM | POA: Insufficient documentation

## 2021-12-30 MED ORDER — SERTRALINE HCL 25 MG PO TABS
25.0000 mg | ORAL_TABLET | Freq: Every day | ORAL | 3 refills | Status: DC
  Filled 2021-12-30 – 2022-01-24 (×3): qty 90, 90d supply, fill #0
  Filled 2022-05-10: qty 90, 90d supply, fill #1
  Filled 2022-08-12: qty 90, 90d supply, fill #2
  Filled 2022-11-22: qty 90, 90d supply, fill #3

## 2021-12-30 NOTE — Progress Notes (Signed)
Complete physical exam  Patient: Natalie Stewart   DOB: 1985-08-28   36 y.o. Female  MRN: [id number]  Subjective:    Chief Complaint  Patient presents with   Annual Exam    Charika Mikelson is a 36 y.o. female who presents today for a complete physical exam. She reports consuming a general diet. Exercise is limited by orthopedic condition(s): recent ACL tear, PT twice weekly. She generally feels well. She reports sleeping fairly well but some interruption with her son waking up in the middle of the night. She does not have additional problems to discuss today.    Most recent fall risk assessment:    12/30/2021    9:19 AM  Fall Risk   Falls in the past year? 1  Number falls in past yr: 0  Injury with Fall? 1  Risk for fall due to : History of fall(s)  Follow up Falls evaluation completed     Most recent depression screenings:    12/30/2021    9:19 AM 09/29/2020    2:57 PM  PHQ 2/9 Scores  PHQ - 2 Score 0 4  PHQ- 9 Score 1 11    Vision:Within last year, Dental: No current dental problems and Receives regular dental care, and STD: The patient denies history of sexually transmitted disease.    Patient Care Team: Christen Butter, NP as PCP - General (Nurse Practitioner) End, Cristal Deer, MD as PCP - Cardiology (Cardiology)   Outpatient Medications Prior to Visit  Medication Sig   acetaminophen (TYLENOL) 325 MG tablet Take 2 tablets (650 mg total) by mouth every 6 (six) hours as needed (for pain scale < 4).   aspirin EC 81 MG tablet Take 81 mg by mouth in the morning and at bedtime. Swallow whole.   Prenatal Vit-Fe Fumarate-FA (PRENATAL MULTIVITAMIN) TABS tablet Take 1 tablet by mouth daily at 12 noon.   [DISCONTINUED] sertraline (ZOLOFT) 50 MG tablet Take 1 tablet (50 mg total) by mouth daily.   [DISCONTINUED] cephALEXin (KEFLEX) 500 MG capsule Take 1 capsule (500 mg total) by mouth every 6 (six) hours for 3 days.   [DISCONTINUED] ibuprofen (ADVIL) 600 MG tablet Take 1 tablet (600 mg  total) by mouth every 6 (six) hours as needed.   [DISCONTINUED] methocarbamol (ROBAXIN) 500 MG tablet Take 1-2 tablet(s) 4 TIMES A DAY by oral route as needed for muscle spasms.   [DISCONTINUED] norethindrone (ORTHO MICRONOR) 0.35 MG tablet Take 1 tablet (0.35 mg total) by mouth daily.   [DISCONTINUED] oxyCODONE (OXY IR/ROXICODONE) 5 MG immediate release tablet Take 1 tablet(s) EVERY 4-6 HOURS as needed by oral route for severe pain.   [DISCONTINUED] promethazine (PHENERGAN) 25 MG tablet Take 1 tablet(s) EVERY 4 HOURS by oral route as needed for nausea.   [DISCONTINUED] traMADol (ULTRAM) 50 MG tablet Take 1 tablet every 6 hours by oral route.   No facility-administered medications prior to visit.    Review of Systems  Constitutional:  Negative for chills, fever, malaise/fatigue and weight loss.  HENT:  Negative for congestion, ear pain, hearing loss, sinus pain and sore throat.   Eyes:  Negative for blurred vision, photophobia and pain.  Respiratory:  Negative for cough, shortness of breath and wheezing.   Cardiovascular:  Negative for chest pain, palpitations and leg swelling.  Gastrointestinal:  Negative for abdominal pain, constipation, diarrhea, heartburn, nausea and vomiting.  Genitourinary:  Negative for dysuria, frequency and urgency.  Musculoskeletal:  Positive for joint pain (left knee). Negative for falls and neck pain.  Skin:  Positive for rash (left elbow). Negative for itching.  Neurological:  Negative for dizziness, weakness and headaches.  Endo/Heme/Allergies:  Negative for polydipsia. Does not bruise/bleed easily.  Psychiatric/Behavioral:  Negative for depression, substance abuse and suicidal ideas. The patient is not nervous/anxious and does not have insomnia.      Objective:    BP 109/70 (BP Location: Right Arm, Cuff Size: Small)   Pulse 91   Resp 20   Ht 5\' 3"  (1.6 m)   Wt 128 lb 1.6 oz (58.1 kg)   SpO2 97%   BMI 22.69 kg/m    Physical Exam Constitutional:       General: She is not in acute distress.    Appearance: Normal appearance. She is normal weight. She is not ill-appearing.  HENT:     Head: Normocephalic and atraumatic.     Right Ear: Tympanic membrane, ear canal and external ear normal. There is no impacted cerumen.     Left Ear: Tympanic membrane, ear canal and external ear normal. There is no impacted cerumen.     Nose: Nose normal. No congestion or rhinorrhea.     Mouth/Throat:     Mouth: Mucous membranes are moist.     Pharynx: No oropharyngeal exudate or posterior oropharyngeal erythema.  Eyes:     General: No scleral icterus.       Right eye: No discharge.        Left eye: No discharge.     Extraocular Movements: Extraocular movements intact.     Conjunctiva/sclera: Conjunctivae normal.     Pupils: Pupils are equal, round, and reactive to light.  Neck:     Thyroid: No thyromegaly.     Vascular: No carotid bruit or JVD.     Trachea: Trachea normal.  Cardiovascular:     Rate and Rhythm: Normal rate and regular rhythm.     Pulses: Normal pulses.     Heart sounds: Normal heart sounds. No murmur heard.    No friction rub. No gallop.  Pulmonary:     Effort: Pulmonary effort is normal. No respiratory distress.     Breath sounds: Normal breath sounds. No wheezing.  Abdominal:     General: Bowel sounds are normal. There is no distension.     Palpations: Abdomen is soft. There is no mass.     Tenderness: There is no abdominal tenderness. There is no guarding or rebound.     Hernia: No hernia is present.  Musculoskeletal:        General: Normal range of motion.     Cervical back: Normal range of motion and neck supple.  Lymphadenopathy:     Cervical: No cervical adenopathy.  Skin:    General: Skin is warm and dry.     Findings: Rash (left elbow) present.  Neurological:     Mental Status: She is alert and oriented to person, place, and time.     Cranial Nerves: No cranial nerve deficit.  Psychiatric:        Mood and Affect:  Mood normal.        Behavior: Behavior normal.        Thought Content: Thought content normal.        Judgment: Judgment normal.   No results found for any visits on 12/30/21.     Assessment & Plan:    Routine Health Maintenance and Physical Exam  Immunization History  Administered Date(s) Administered   Influenza,inj,Quad PF,6+ Mos 12/30/2021   Influenza,inj,quad, With Preservative 12/31/2020  Influenza-Unspecified 01/28/2019, 01/03/2020   PFIZER(Purple Top)SARS-COV-2 Vaccination 02/27/2019, 03/20/2019   Tdap 10/15/2015, 03/18/2021    Health Maintenance  Topic Date Due   PAP SMEAR-Modifier  12/21/2019   COVID-19 Vaccine (3 - Pfizer series) 01/15/2022 (Originally 05/15/2019)   TETANUS/TDAP  03/19/2031   INFLUENZA VACCINE  Completed   Hepatitis C Screening  Completed   HPV VACCINES  Aged Out   HIV Screening  Discontinued    Discussed health benefits of physical activity, and encouraged her to engage in regular exercise appropriate for her age and condition.  1. Annual physical exam Checking labs as below.  Up-to-date on preventative care.  Wellness information provided with AVS. - Lipid panel - CBC with Differential/Platelet - Comprehensive Metabolic Panel (CMET)  2. Need for influenza vaccination Flu vaccine given in office today. - Flu Vaccine QUAD 6+ mos PF IM (Fluarix Quad PF)  3. Anxiety Stable.  Continue sertraline 25 mg daily. - sertraline (ZOLOFT) 25 MG tablet; Take 1 tablet (25 mg total) by mouth daily.  Dispense: 90 tablet; Refill: 3  4. History of gestational diabetes Checking hemoglobin A1c. - Hemoglobin A1c  Return in about 1 year (around 12/31/2022) for annual physical exam or sooner if needed.   Samuel Bouche, NP

## 2021-12-31 ENCOUNTER — Ambulatory Visit: Payer: 59 | Attending: Medical-Surgical

## 2021-12-31 DIAGNOSIS — Z8632 Personal history of gestational diabetes: Secondary | ICD-10-CM | POA: Diagnosis not present

## 2021-12-31 DIAGNOSIS — Z0000 Encounter for general adult medical examination without abnormal findings: Secondary | ICD-10-CM | POA: Diagnosis not present

## 2022-01-01 LAB — HEMOGLOBIN A1C
Est. average glucose Bld gHb Est-mCnc: 94 mg/dL
Hgb A1c MFr Bld: 4.9 % (ref 4.8–5.6)

## 2022-01-01 LAB — COMPREHENSIVE METABOLIC PANEL
ALT: 14 IU/L (ref 0–32)
AST: 16 IU/L (ref 0–40)
Albumin/Globulin Ratio: 2.4 — ABNORMAL HIGH (ref 1.2–2.2)
Albumin: 4.6 g/dL (ref 3.9–4.9)
Alkaline Phosphatase: 102 IU/L (ref 44–121)
BUN/Creatinine Ratio: 29 — ABNORMAL HIGH (ref 9–23)
BUN: 23 mg/dL — ABNORMAL HIGH (ref 6–20)
Bilirubin Total: 0.6 mg/dL (ref 0.0–1.2)
CO2: 23 mmol/L (ref 20–29)
Calcium: 9.2 mg/dL (ref 8.7–10.2)
Chloride: 102 mmol/L (ref 96–106)
Creatinine, Ser: 0.78 mg/dL (ref 0.57–1.00)
Globulin, Total: 1.9 g/dL (ref 1.5–4.5)
Glucose: 93 mg/dL (ref 70–99)
Potassium: 4.2 mmol/L (ref 3.5–5.2)
Sodium: 137 mmol/L (ref 134–144)
Total Protein: 6.5 g/dL (ref 6.0–8.5)
eGFR: 102 mL/min/{1.73_m2} (ref >59)

## 2022-01-01 LAB — LIPID PANEL
Chol/HDL Ratio: 2.5 ratio (ref 0.0–4.4)
Cholesterol, Total: 163 mg/dL (ref 100–199)
HDL: 64 mg/dL (ref >39)
LDL Chol Calc (NIH): 88 mg/dL (ref 0–99)
Triglycerides: 56 mg/dL (ref 0–149)
VLDL Cholesterol Cal: 11 mg/dL (ref 5–40)

## 2022-01-01 LAB — CBC WITH DIFFERENTIAL/PLATELET
Basophils Absolute: 0 10*3/uL (ref 0.0–0.2)
Basos: 1 %
EOS (ABSOLUTE): 0.1 10*3/uL (ref 0.0–0.4)
Eos: 2 %
Hematocrit: 38.4 % (ref 34.0–46.6)
Hemoglobin: 12.3 g/dL (ref 11.1–15.9)
Immature Grans (Abs): 0 10*3/uL (ref 0.0–0.1)
Immature Granulocytes: 0 %
Lymphocytes Absolute: 1.5 10*3/uL (ref 0.7–3.1)
Lymphs: 27 %
MCH: 29.2 pg (ref 26.6–33.0)
MCHC: 32 g/dL (ref 31.5–35.7)
MCV: 91 fL (ref 79–97)
Monocytes Absolute: 0.4 10*3/uL (ref 0.1–0.9)
Monocytes: 8 %
Neutrophils Absolute: 3.5 10*3/uL (ref 1.4–7.0)
Neutrophils: 62 %
Platelets: 215 10*3/uL (ref 150–450)
RBC: 4.21 x10E6/uL (ref 3.77–5.28)
RDW: 13.5 % (ref 11.7–15.4)
WBC: 5.5 10*3/uL (ref 3.4–10.8)

## 2022-01-03 DIAGNOSIS — M25562 Pain in left knee: Secondary | ICD-10-CM | POA: Diagnosis not present

## 2022-01-03 DIAGNOSIS — Z4789 Encounter for other orthopedic aftercare: Secondary | ICD-10-CM | POA: Diagnosis not present

## 2022-01-05 DIAGNOSIS — M25562 Pain in left knee: Secondary | ICD-10-CM | POA: Diagnosis not present

## 2022-01-10 ENCOUNTER — Other Ambulatory Visit (HOSPITAL_COMMUNITY): Payer: Self-pay

## 2022-01-14 DIAGNOSIS — M25562 Pain in left knee: Secondary | ICD-10-CM | POA: Diagnosis not present

## 2022-01-18 DIAGNOSIS — M25562 Pain in left knee: Secondary | ICD-10-CM | POA: Diagnosis not present

## 2022-01-20 DIAGNOSIS — M25562 Pain in left knee: Secondary | ICD-10-CM | POA: Diagnosis not present

## 2022-01-24 ENCOUNTER — Other Ambulatory Visit (HOSPITAL_COMMUNITY): Payer: Self-pay

## 2022-01-26 DIAGNOSIS — M25562 Pain in left knee: Secondary | ICD-10-CM | POA: Diagnosis not present

## 2022-02-02 DIAGNOSIS — M25562 Pain in left knee: Secondary | ICD-10-CM | POA: Diagnosis not present

## 2022-02-04 DIAGNOSIS — M25562 Pain in left knee: Secondary | ICD-10-CM | POA: Diagnosis not present

## 2022-02-09 DIAGNOSIS — M25562 Pain in left knee: Secondary | ICD-10-CM | POA: Diagnosis not present

## 2022-02-10 ENCOUNTER — Encounter: Payer: Self-pay | Admitting: Medical-Surgical

## 2022-02-11 DIAGNOSIS — M25562 Pain in left knee: Secondary | ICD-10-CM | POA: Diagnosis not present

## 2022-02-17 ENCOUNTER — Encounter: Payer: 59 | Admitting: Medical-Surgical

## 2022-02-17 DIAGNOSIS — M25562 Pain in left knee: Secondary | ICD-10-CM | POA: Diagnosis not present

## 2022-02-17 NOTE — Progress Notes (Signed)
Erroneous encounter. Please disregard.

## 2022-02-21 DIAGNOSIS — M25562 Pain in left knee: Secondary | ICD-10-CM | POA: Diagnosis not present

## 2022-02-23 DIAGNOSIS — M25562 Pain in left knee: Secondary | ICD-10-CM | POA: Diagnosis not present

## 2022-03-03 DIAGNOSIS — M25562 Pain in left knee: Secondary | ICD-10-CM | POA: Diagnosis not present

## 2022-03-08 DIAGNOSIS — M25562 Pain in left knee: Secondary | ICD-10-CM | POA: Diagnosis not present

## 2022-03-10 DIAGNOSIS — M25562 Pain in left knee: Secondary | ICD-10-CM | POA: Diagnosis not present

## 2022-03-15 DIAGNOSIS — M25562 Pain in left knee: Secondary | ICD-10-CM | POA: Diagnosis not present

## 2022-03-17 DIAGNOSIS — M25562 Pain in left knee: Secondary | ICD-10-CM | POA: Diagnosis not present

## 2022-03-22 DIAGNOSIS — M25562 Pain in left knee: Secondary | ICD-10-CM | POA: Diagnosis not present

## 2022-03-24 DIAGNOSIS — M25562 Pain in left knee: Secondary | ICD-10-CM | POA: Diagnosis not present

## 2022-03-28 DIAGNOSIS — M25562 Pain in left knee: Secondary | ICD-10-CM | POA: Diagnosis not present

## 2022-03-30 DIAGNOSIS — Z4789 Encounter for other orthopedic aftercare: Secondary | ICD-10-CM | POA: Diagnosis not present

## 2022-03-30 DIAGNOSIS — M25562 Pain in left knee: Secondary | ICD-10-CM | POA: Diagnosis not present

## 2022-04-13 DIAGNOSIS — M25562 Pain in left knee: Secondary | ICD-10-CM | POA: Diagnosis not present

## 2022-04-15 DIAGNOSIS — M25562 Pain in left knee: Secondary | ICD-10-CM | POA: Diagnosis not present

## 2022-04-19 DIAGNOSIS — M25562 Pain in left knee: Secondary | ICD-10-CM | POA: Diagnosis not present

## 2022-04-29 DIAGNOSIS — M25562 Pain in left knee: Secondary | ICD-10-CM | POA: Diagnosis not present

## 2022-05-05 DIAGNOSIS — M25562 Pain in left knee: Secondary | ICD-10-CM | POA: Diagnosis not present

## 2022-05-10 ENCOUNTER — Other Ambulatory Visit (HOSPITAL_COMMUNITY): Payer: Self-pay

## 2022-05-10 DIAGNOSIS — M25562 Pain in left knee: Secondary | ICD-10-CM | POA: Diagnosis not present

## 2022-05-30 DIAGNOSIS — Z4789 Encounter for other orthopedic aftercare: Secondary | ICD-10-CM | POA: Diagnosis not present

## 2022-08-12 ENCOUNTER — Other Ambulatory Visit (HOSPITAL_COMMUNITY): Payer: Self-pay

## 2022-10-03 ENCOUNTER — Encounter: Payer: Self-pay | Admitting: Medical-Surgical

## 2022-10-03 ENCOUNTER — Ambulatory Visit: Payer: Commercial Managed Care - PPO | Admitting: Medical-Surgical

## 2022-10-03 ENCOUNTER — Other Ambulatory Visit (HOSPITAL_COMMUNITY): Payer: Self-pay

## 2022-10-03 VITALS — BP 113/68 | HR 66 | Resp 20 | Ht 63.0 in | Wt 141.0 lb

## 2022-10-03 DIAGNOSIS — J329 Chronic sinusitis, unspecified: Secondary | ICD-10-CM

## 2022-10-03 DIAGNOSIS — J40 Bronchitis, not specified as acute or chronic: Secondary | ICD-10-CM

## 2022-10-03 MED ORDER — FLUCONAZOLE 150 MG PO TABS
150.0000 mg | ORAL_TABLET | Freq: Once | ORAL | 0 refills | Status: AC
  Filled 2022-10-03: qty 1, 1d supply, fill #0

## 2022-10-03 MED ORDER — AMOXICILLIN-POT CLAVULANATE 875-125 MG PO TABS
1.0000 | ORAL_TABLET | Freq: Two times a day (BID) | ORAL | 0 refills | Status: DC
  Filled 2022-10-03: qty 14, 7d supply, fill #0

## 2022-10-03 NOTE — Progress Notes (Signed)
        Established patient visit  History, exam, impression, and plan:  1. Sinobronchitis Pleasant 37 year old female presenting today with complaints of 2 weeks of upper respiratory symptoms including sore throat, postnasal drip, cough productive of thick yellow mucus, and generalized malaise.  Notes that she originally had laryngitis however this has resolved.  Last night, she developed significant left ear pain as well as left-sided throat pain.  She took some ibuprofen which did help with symptoms however only temporarily.  See below for physical exam.  Symptoms consistent with sinobronchitis.  Deferring COVID/flu testing due to timeline of illness.  Unable to determine if there is otitis media due to cerumen impaction bilaterally.  Offered irrigation however would like to avoid worsening symptoms in the setting of possible infection.  Treating with Augmentin twice daily x 7 days.  Adding Diflucan for VVC prophylaxis.  Continue conservative measures at home.  Once symptoms have improved/resolved, okay to schedule for nurse visit to return for bilateral ear irrigation due to cerumen impaction.  Physical Exam Vitals reviewed.  Constitutional:      General: She is not in acute distress.    Appearance: Normal appearance. She is well-developed and normal weight. She is not ill-appearing.  HENT:     Head: Normocephalic and atraumatic.     Right Ear: Ear canal and external ear normal. There is impacted cerumen.     Left Ear: Ear canal and external ear normal. There is impacted cerumen.     Nose: Nose normal.     Right Turbinates: Not swollen.     Left Turbinates: Not swollen.     Right Sinus: No maxillary sinus tenderness or frontal sinus tenderness.     Left Sinus: No maxillary sinus tenderness or frontal sinus tenderness.     Mouth/Throat:     Mouth: Mucous membranes are moist.     Pharynx: Posterior oropharyngeal erythema present. No oropharyngeal exudate.     Tonsils: No tonsillar exudate  or tonsillar abscesses.  Eyes:     General: No scleral icterus.       Right eye: No discharge.        Left eye: No discharge.     Extraocular Movements: Extraocular movements intact.     Conjunctiva/sclera: Conjunctivae normal.     Pupils: Pupils are equal, round, and reactive to light.  Cardiovascular:     Rate and Rhythm: Normal rate and regular rhythm.     Pulses: Normal pulses.     Heart sounds: Normal heart sounds. No murmur heard.    No friction rub. No gallop.  Pulmonary:     Effort: Pulmonary effort is normal. No respiratory distress.     Breath sounds: Normal breath sounds. No wheezing.  Musculoskeletal:     Cervical back: Neck supple.  Lymphadenopathy:     Cervical: No cervical adenopathy.  Skin:    General: Skin is warm and dry.  Neurological:     Mental Status: She is alert and oriented to person, place, and time.  Psychiatric:        Mood and Affect: Mood normal.        Behavior: Behavior normal.        Thought Content: Thought content normal.        Judgment: Judgment normal.    Procedures performed this visit: None.  Return if symptoms worsen or fail to improve.  __________________________________ Thayer Ohm, DNP, APRN, FNP-BC Primary Care and Sports Medicine [facility] [place]

## 2022-11-04 ENCOUNTER — Encounter: Payer: Self-pay | Admitting: Medical-Surgical

## 2022-11-09 DIAGNOSIS — N911 Secondary amenorrhea: Secondary | ICD-10-CM | POA: Diagnosis not present

## 2022-11-22 DIAGNOSIS — Z3685 Encounter for antenatal screening for Streptococcus B: Secondary | ICD-10-CM | POA: Diagnosis not present

## 2022-11-22 DIAGNOSIS — Z3481 Encounter for supervision of other normal pregnancy, first trimester: Secondary | ICD-10-CM | POA: Diagnosis not present

## 2022-11-22 DIAGNOSIS — Z3A09 9 weeks gestation of pregnancy: Secondary | ICD-10-CM | POA: Diagnosis not present

## 2022-11-22 LAB — OB RESULTS CONSOLE HIV ANTIBODY (ROUTINE TESTING): HIV: NONREACTIVE

## 2022-11-22 LAB — HEPATITIS C ANTIBODY: HCV Ab: NEGATIVE

## 2022-11-22 LAB — OB RESULTS CONSOLE RUBELLA ANTIBODY, IGM: Rubella: IMMUNE

## 2022-11-22 LAB — OB RESULTS CONSOLE HEPATITIS B SURFACE ANTIGEN: Hepatitis B Surface Ag: NEGATIVE

## 2022-11-22 LAB — OB RESULTS CONSOLE RPR: RPR: NONREACTIVE

## 2022-11-23 ENCOUNTER — Other Ambulatory Visit (HOSPITAL_COMMUNITY): Payer: Self-pay

## 2022-12-05 ENCOUNTER — Encounter (HOSPITAL_COMMUNITY): Payer: Self-pay

## 2022-12-05 ENCOUNTER — Other Ambulatory Visit (HOSPITAL_COMMUNITY): Payer: Self-pay

## 2022-12-05 DIAGNOSIS — Z113 Encounter for screening for infections with a predominantly sexual mode of transmission: Secondary | ICD-10-CM | POA: Diagnosis not present

## 2022-12-05 DIAGNOSIS — Z1151 Encounter for screening for human papillomavirus (HPV): Secondary | ICD-10-CM | POA: Diagnosis not present

## 2022-12-05 DIAGNOSIS — Z124 Encounter for screening for malignant neoplasm of cervix: Secondary | ICD-10-CM | POA: Diagnosis not present

## 2022-12-05 DIAGNOSIS — Z3400 Encounter for supervision of normal first pregnancy, unspecified trimester: Secondary | ICD-10-CM | POA: Diagnosis not present

## 2022-12-05 DIAGNOSIS — Z3A11 11 weeks gestation of pregnancy: Secondary | ICD-10-CM | POA: Diagnosis not present

## 2022-12-05 LAB — OB RESULTS CONSOLE GC/CHLAMYDIA
Chlamydia: NEGATIVE
Neisseria Gonorrhea: NEGATIVE

## 2022-12-05 MED ORDER — DOXYLAMINE-PYRIDOXINE 10-10 MG PO TBEC
2.0000 | DELAYED_RELEASE_TABLET | Freq: Every day | ORAL | 0 refills | Status: DC
  Filled 2022-12-05: qty 60, 30d supply, fill #0

## 2022-12-05 MED ORDER — ONDANSETRON 4 MG PO TBDP
8.0000 mg | ORAL_TABLET | Freq: Two times a day (BID) | ORAL | 0 refills | Status: DC
  Filled 2022-12-05: qty 30, 8d supply, fill #0

## 2022-12-06 ENCOUNTER — Other Ambulatory Visit (HOSPITAL_COMMUNITY): Payer: Self-pay

## 2022-12-20 DIAGNOSIS — M222X2 Patellofemoral disorders, left knee: Secondary | ICD-10-CM | POA: Diagnosis not present

## 2022-12-20 DIAGNOSIS — M7652 Patellar tendinitis, left knee: Secondary | ICD-10-CM | POA: Diagnosis not present

## 2022-12-20 DIAGNOSIS — M25562 Pain in left knee: Secondary | ICD-10-CM | POA: Diagnosis not present

## 2022-12-29 DIAGNOSIS — Z3482 Encounter for supervision of other normal pregnancy, second trimester: Secondary | ICD-10-CM | POA: Diagnosis not present

## 2022-12-29 DIAGNOSIS — Z3A14 14 weeks gestation of pregnancy: Secondary | ICD-10-CM | POA: Diagnosis not present

## 2022-12-29 DIAGNOSIS — R87610 Atypical squamous cells of undetermined significance on cytologic smear of cervix (ASC-US): Secondary | ICD-10-CM | POA: Diagnosis not present

## 2023-01-02 DIAGNOSIS — M25562 Pain in left knee: Secondary | ICD-10-CM | POA: Diagnosis not present

## 2023-01-03 ENCOUNTER — Encounter: Payer: Commercial Managed Care - PPO | Admitting: Medical-Surgical

## 2023-01-03 ENCOUNTER — Encounter: Payer: Self-pay | Admitting: Medical-Surgical

## 2023-01-03 NOTE — Progress Notes (Deleted)
   Complete physical exam  Patient: Natalie Stewart   DOB: 03/28/1985   37 y.o. Female  MRN: [id number]  Subjective:    No chief complaint on file.   Natalie Stewart is a 37 y.o. female who presents today for a complete physical exam. She reports consuming a {diet types:17450} diet. {types:19826} She generally feels {DESC; WELL/FAIRLY WELL/POORLY:18703}. She reports sleeping {DESC; WELL/FAIRLY WELL/POORLY:18703}. She {does/does not:200015} have additional problems to discuss today.    Most recent fall risk assessment:    10/03/2022    4:41 PM  Fall Risk   Falls in the past year? 0  Number falls in past yr: 0  Injury with Fall? 0  Risk for fall due to : No Fall Risks  Follow up Falls evaluation completed     Most recent depression screenings:    10/03/2022    4:41 PM 12/30/2021    9:19 AM  PHQ 2/9 Scores  PHQ - 2 Score 0 0  PHQ- 9 Score  1    {VISON DENTAL STD PSA (Optional):27386}  {History (Optional):23778}  Patient Care Team: Christen Butter, NP as PCP - General (Nurse Practitioner) End, Cristal Deer, MD as PCP - Cardiology (Cardiology)   Outpatient Medications Prior to Visit  Medication Sig   amoxicillin-clavulanate (AUGMENTIN) 875-125 MG tablet Take 1 tablet by mouth 2 (two) times daily.   Doxylamine-Pyridoxine 10-10 MG TBEC Take 2 tablets by mouth daily.   ibuprofen (ADVIL) 400 MG tablet Take 400 mg by mouth every 8 (eight) hours as needed.   ondansetron (ZOFRAN-ODT) 4 MG disintegrating tablet Dissolve 2 tablets (8 mg total) in the mouth 2 (two) times daily.   Prenatal Vit-Fe Fumarate-FA (PRENATAL MULTIVITAMIN) TABS tablet Take 1 tablet by mouth daily at 12 noon.   sertraline (ZOLOFT) 25 MG tablet Take 1 tablet (25 mg total) by mouth daily.   No facility-administered medications prior to visit.    ROS        Objective:     There were no vitals taken for this visit. {Vitals History (Optional):23777}  Physical Exam   No results found for any visits on  01/03/23. {Show previous labs (optional):23779}    Assessment & Plan:    Routine Health Maintenance and Physical Exam  Immunization History  Administered Date(s) Administered   Influenza,inj,Quad PF,6+ Mos 12/30/2021   Influenza,inj,quad, With Preservative 12/31/2020   Influenza-Unspecified 01/28/2019, 01/03/2020   PFIZER(Purple Top)SARS-COV-2 Vaccination 02/27/2019, 03/20/2019   Tdap 10/15/2015, 03/18/2021    Health Maintenance  Topic Date Due   Cervical Cancer Screening (HPV/Pap Cotest)  Never done   INFLUENZA VACCINE  10/06/2022   COVID-19 Vaccine (3 - 2023-24 season) 11/06/2022   DTaP/Tdap/Td (3 - Td or Tdap) 03/19/2031   Hepatitis C Screening  Completed   HPV VACCINES  Aged Out   HIV Screening  Discontinued    Discussed health benefits of physical activity, and encouraged her to engage in regular exercise appropriate for her age and condition.  Problem List Items Addressed This Visit   None Visit Diagnoses     Annual physical exam    -  Primary   History of gestational diabetes       Lipid screening          No follow-ups on file.     Christen Butter, NP

## 2023-01-04 DIAGNOSIS — Z361 Encounter for antenatal screening for raised alphafetoprotein level: Secondary | ICD-10-CM | POA: Diagnosis not present

## 2023-01-04 DIAGNOSIS — M25562 Pain in left knee: Secondary | ICD-10-CM | POA: Diagnosis not present

## 2023-01-12 DIAGNOSIS — M25562 Pain in left knee: Secondary | ICD-10-CM | POA: Diagnosis not present

## 2023-01-16 ENCOUNTER — Other Ambulatory Visit (HOSPITAL_COMMUNITY): Payer: Self-pay

## 2023-01-16 DIAGNOSIS — Z3400 Encounter for supervision of normal first pregnancy, unspecified trimester: Secondary | ICD-10-CM | POA: Diagnosis not present

## 2023-01-16 DIAGNOSIS — R300 Dysuria: Secondary | ICD-10-CM | POA: Diagnosis not present

## 2023-01-16 DIAGNOSIS — N390 Urinary tract infection, site not specified: Secondary | ICD-10-CM | POA: Diagnosis not present

## 2023-01-16 DIAGNOSIS — Z3A17 17 weeks gestation of pregnancy: Secondary | ICD-10-CM | POA: Diagnosis not present

## 2023-01-16 MED ORDER — NITROFURANTOIN MONOHYD MACRO 100 MG PO CAPS
100.0000 mg | ORAL_CAPSULE | Freq: Two times a day (BID) | ORAL | 0 refills | Status: DC
  Filled 2023-01-16: qty 14, 7d supply, fill #0

## 2023-01-18 DIAGNOSIS — M25562 Pain in left knee: Secondary | ICD-10-CM | POA: Diagnosis not present

## 2023-01-23 DIAGNOSIS — M25562 Pain in left knee: Secondary | ICD-10-CM | POA: Diagnosis not present

## 2023-02-01 DIAGNOSIS — Z3A19 19 weeks gestation of pregnancy: Secondary | ICD-10-CM | POA: Diagnosis not present

## 2023-02-01 DIAGNOSIS — M25562 Pain in left knee: Secondary | ICD-10-CM | POA: Diagnosis not present

## 2023-02-01 DIAGNOSIS — O09522 Supervision of elderly multigravida, second trimester: Secondary | ICD-10-CM | POA: Diagnosis not present

## 2023-02-01 DIAGNOSIS — Z3482 Encounter for supervision of other normal pregnancy, second trimester: Secondary | ICD-10-CM | POA: Diagnosis not present

## 2023-02-01 DIAGNOSIS — Z363 Encounter for antenatal screening for malformations: Secondary | ICD-10-CM | POA: Diagnosis not present

## 2023-02-06 DIAGNOSIS — M25562 Pain in left knee: Secondary | ICD-10-CM | POA: Diagnosis not present

## 2023-02-08 DIAGNOSIS — M25562 Pain in left knee: Secondary | ICD-10-CM | POA: Diagnosis not present

## 2023-02-22 ENCOUNTER — Other Ambulatory Visit: Payer: Self-pay | Admitting: Medical-Surgical

## 2023-02-22 DIAGNOSIS — F419 Anxiety disorder, unspecified: Secondary | ICD-10-CM

## 2023-02-24 ENCOUNTER — Other Ambulatory Visit (HOSPITAL_COMMUNITY): Payer: Self-pay

## 2023-02-24 ENCOUNTER — Telehealth: Payer: Self-pay

## 2023-02-24 DIAGNOSIS — F419 Anxiety disorder, unspecified: Secondary | ICD-10-CM

## 2023-02-24 MED ORDER — SERTRALINE HCL 25 MG PO TABS
25.0000 mg | ORAL_TABLET | Freq: Every day | ORAL | 0 refills | Status: DC
  Filled 2023-02-24: qty 90, 90d supply, fill #0

## 2023-02-24 MED ORDER — SERTRALINE HCL 25 MG PO TABS
25.0000 mg | ORAL_TABLET | Freq: Every day | ORAL | 0 refills | Status: DC
  Filled 2023-02-24 (×2): qty 30, 30d supply, fill #0

## 2023-02-24 NOTE — Telephone Encounter (Signed)
Requesting rx rf of zoloft 25mg  Last written 12/30/2021 Last OV 10/03/2022 sick visit Upcoming appt= none

## 2023-02-24 NOTE — Telephone Encounter (Signed)
Copied from CRM [id number]. Topic: Clinical - Prescription Issue >> Feb 24, 2023  1:09 PM Natalie Stewart wrote: Reason for CRM: Patient called to check the status of refill request for sertraline (ZOLOFT) 25 MG tablet. Advised patient request still showing. Callback number is [phone].

## 2023-02-24 NOTE — Telephone Encounter (Signed)
Zoloft 25 mg sent for 30-day supply as she is overdue for follow-up. ___________________________________________ Thayer Ohm, DNP, APRN, FNP-BC Primary Care and Sports Medicine [facility] [place]

## 2023-02-27 ENCOUNTER — Telehealth: Payer: Self-pay

## 2023-02-27 NOTE — Telephone Encounter (Signed)
Second attempt made to contact the patient. No answer, left a vm msg. Patient notified to contact the office to schedule an appointment prior to next refill request.

## 2023-02-27 NOTE — Telephone Encounter (Signed)
30 day supply of sertraline sent to pharmacy.  Patient will need to RTC for refills  Call has been made to patient and voice mail message left requesting a return cal.

## 2023-02-27 NOTE — Telephone Encounter (Signed)
Attempted call to patient. Left a voice mail message requesting a return call.  

## 2023-02-27 NOTE — Telephone Encounter (Signed)
Copied from CRM [id number]. Topic: Clinical - Prescription Issue >> Feb 24, 2023  4:23 PM Elle L wrote: Reason for CRM: The patient has spoken to her pharmacy and is requesting that NP Christen Butter give a verbal order for her sertraline (ZOLOFT) 25 MG tablet.  Her preferred pharmacy is [place] [facility] [address], [place] [address] Phone: [phone]  Fax: [phone]   Her callback number is [phone].

## 2023-03-03 NOTE — Telephone Encounter (Signed)
Patient scheduled for March 21, 2023 .

## 2023-03-08 NOTE — L&D Delivery Note (Signed)
 Delivery Note  SVD viable female Apgars over 8,9 over 2nd degree ML lac.  Bleeder in right edge at hymen hemostasis with figure of 8 2-0 chromic.  Placenta delivered spontaneously intact with 3VC. Repair with 2-0 with good support and hemostasis noted.  R/V exam confirms. .   Mother and baby to couplet care and are doing well.  EBL 375 cc  [place], MD

## 2023-03-21 ENCOUNTER — Other Ambulatory Visit (HOSPITAL_COMMUNITY): Payer: Self-pay

## 2023-03-21 ENCOUNTER — Encounter: Payer: Self-pay | Admitting: Medical-Surgical

## 2023-03-21 ENCOUNTER — Ambulatory Visit: Payer: Commercial Managed Care - PPO | Admitting: Medical-Surgical

## 2023-03-21 VITALS — BP 99/61 | HR 73 | Resp 20 | Ht 63.0 in | Wt 155.1 lb

## 2023-03-21 DIAGNOSIS — J019 Acute sinusitis, unspecified: Secondary | ICD-10-CM | POA: Diagnosis not present

## 2023-03-21 DIAGNOSIS — B9689 Other specified bacterial agents as the cause of diseases classified elsewhere: Secondary | ICD-10-CM | POA: Diagnosis not present

## 2023-03-21 DIAGNOSIS — F419 Anxiety disorder, unspecified: Secondary | ICD-10-CM | POA: Diagnosis not present

## 2023-03-21 MED ORDER — SERTRALINE HCL 25 MG PO TABS
25.0000 mg | ORAL_TABLET | Freq: Every day | ORAL | 3 refills | Status: DC
  Filled 2023-03-21 – 2023-06-24 (×2): qty 90, 90d supply, fill #0
  Filled 2023-09-24 – 2023-09-25 (×2): qty 90, 90d supply, fill #1
  Filled 2023-12-19: qty 90, 90d supply, fill #2

## 2023-03-21 MED ORDER — AMOXICILLIN-POT CLAVULANATE 875-125 MG PO TABS
1.0000 | ORAL_TABLET | Freq: Two times a day (BID) | ORAL | 0 refills | Status: DC
  Filled 2023-03-21: qty 14, 7d supply, fill #0

## 2023-03-21 NOTE — Progress Notes (Signed)
        Established patient visit  History, exam, impression, and plan:  1. Anxiety (Primary) 3 pleasant 38 year old female presenting today for follow-up on anxiety.  She has been treated long-term with sertraline  25 mg daily which seems to work well for her.  No side effects or intolerance to the medication.  Feels that her symptoms are well-managed and her mood is stable.  Denies SI/HI.  Overall affect, mood, thought pattern, cognition, and speech are normal today.  Continue sertraline  25 mg daily.  Refills sent for the next year. - sertraline  (ZOLOFT ) 25 MG tablet; Take 1 tablet (25 mg total) by mouth daily.  Dispense: 90 tablet; Refill: 3  2. Acute bacterial sinusitis Unfortunately developed some upper respiratory symptoms approximately 10 days ago consistent with a viral infection.  These symptoms have gradually improved however now she has residual sinus congestion with postnasal drip.  Nasal discharge and sputum thick, yellow.  Limited in over-the-counter options due to pregnancy.  Plan to treat with Augmentin  twice daily for 7 days. - amoxicillin -clavulanate (AUGMENTIN ) 875-125 MG tablet; Take 1 tablet by mouth 2 (two) times daily.  Dispense: 14 tablet; Refill: 0  Procedures performed this visit: None.  Return in about 1 year (around 03/20/2024) for mood follow up.  __________________________________ Zada FREDRIK Palin, DNP, APRN, FNP-BC Primary Care and Sports Medicine [facility] [place]

## 2023-03-21 NOTE — Addendum Note (Signed)
 Addended byChristen Butter on: 03/21/2023 08:35 PM   Modules accepted: Orders

## 2023-03-22 ENCOUNTER — Other Ambulatory Visit (HOSPITAL_COMMUNITY): Payer: Self-pay

## 2023-03-22 ENCOUNTER — Telehealth: Payer: Self-pay

## 2023-03-22 ENCOUNTER — Encounter (HOSPITAL_COMMUNITY): Payer: Self-pay

## 2023-03-22 NOTE — Telephone Encounter (Signed)
 Copied from CRM [id number]. Topic: Clinical - Prescription Issue >> Mar 21, 2023  5:48 PM Star [place] wrote: Reason for CRM: went to the pharmacy and they stated the prescription for Augmentin  had been cancelled by a nurse.  Please call patient [phone] to inform what is going to be replacing that.

## 2023-03-22 NOTE — Telephone Encounter (Signed)
 Spoke with [place] pharmacy  States Augmentin  has been ready for pick up since 03/21/23 @ 7:40 Called patient and left a detailed voice mail message on home # ( allowed on DPR ) and left our phone number should patient have any questions.

## 2023-03-28 ENCOUNTER — Encounter: Payer: Self-pay | Admitting: Medical-Surgical

## 2023-03-29 ENCOUNTER — Encounter: Payer: Self-pay | Admitting: Medical-Surgical

## 2023-03-29 ENCOUNTER — Other Ambulatory Visit (HOSPITAL_COMMUNITY): Payer: Self-pay

## 2023-03-29 ENCOUNTER — Ambulatory Visit: Payer: Commercial Managed Care - PPO | Admitting: Medical-Surgical

## 2023-03-29 VITALS — BP 89/48 | HR 116 | Temp 99.4°F | Resp 20 | Ht 63.0 in | Wt 155.0 lb

## 2023-03-29 DIAGNOSIS — J101 Influenza due to other identified influenza virus with other respiratory manifestations: Secondary | ICD-10-CM | POA: Diagnosis not present

## 2023-03-29 LAB — POCT INFLUENZA A/B
Influenza A, POC: POSITIVE — AB
Influenza B, POC: NEGATIVE

## 2023-03-29 LAB — POC COVID19 BINAXNOW: SARS Coronavirus 2 Ag: NEGATIVE

## 2023-03-29 MED ORDER — METHYLPREDNISOLONE 4 MG PO TBPK
ORAL_TABLET | ORAL | 0 refills | Status: DC
  Filled 2023-03-29: qty 21, 6d supply, fill #0

## 2023-03-29 MED ORDER — OSELTAMIVIR PHOSPHATE 75 MG PO CAPS
75.0000 mg | ORAL_CAPSULE | Freq: Two times a day (BID) | ORAL | 0 refills | Status: DC
  Filled 2023-03-29: qty 10, 5d supply, fill #0

## 2023-03-29 NOTE — Progress Notes (Signed)
        Established patient visit  History, exam, impression, and plan:  1. Influenza A (Primary) Pleasant 38 year old female accompanied by her husband presenting today for evaluation of worsening respiratory symptoms.  She was seen last week for follow-up on anxiety where she was also treated for an acute bacterial sinusitis with Augmentin.  She takes her last dose of Augmentin today however last night developed a low-grade fever along with chills.  Has a dry cough most of the time although she has been able to cough up a little thick yellow phlegm.  Endorses dizziness with position changes as well as mild to moderate nausea.  Having bodyaches and feels quite rundown.  She is currently pregnant and has been doing supportive care at home with pushing fluids and using Tylenol for discomfort and fever.  On exam, she is alert, ill-appearing.  Mild tachypnea, unlabored respirations.  Lungs CTA.  HRRR, S1/S2 normal.  Mild tachycardia, worse with position changes.  POCT COVID-negative.  POCT flu negative for flu B but positive for flu A.  Discussed recommendations for treatment.  Reviewed safety of medications during pregnancy.  Adding Tamiflu 75 mg twice daily for 5 days.  Advised against starting steroids too early however with the severity of her symptoms, we will plan to send in a Medrol Dosepak and if she is not feeling any better in 3 to 4 days, she will plan to go ahead and start that. - oseltamivir (TAMIFLU) 75 MG capsule; Take 1 capsule (75 mg total) by mouth 2 (two) times daily.  Dispense: 10 capsule; Refill: 0 - methylPREDNISolone (MEDROL DOSEPAK) 4 MG TBPK tablet; Take as directed.  Dispense: 21 tablet; Refill: 0  Procedures performed this visit: None.  Return if symptoms worsen or fail to improve.  __________________________________ Thayer Ohm, DNP, APRN, FNP-BC Primary Care and Sports Medicine [facility] [place]

## 2023-03-29 NOTE — Addendum Note (Signed)
Addended by: Latanya Presser on: 03/29/2023 01:45 PM   Modules accepted: Orders

## 2023-04-03 DIAGNOSIS — Z3483 Encounter for supervision of other normal pregnancy, third trimester: Secondary | ICD-10-CM | POA: Diagnosis not present

## 2023-04-03 DIAGNOSIS — Z23 Encounter for immunization: Secondary | ICD-10-CM | POA: Diagnosis not present

## 2023-04-03 DIAGNOSIS — Z3480 Encounter for supervision of other normal pregnancy, unspecified trimester: Secondary | ICD-10-CM | POA: Diagnosis not present

## 2023-04-03 DIAGNOSIS — Z3A28 28 weeks gestation of pregnancy: Secondary | ICD-10-CM | POA: Diagnosis not present

## 2023-05-15 ENCOUNTER — Inpatient Hospital Stay (HOSPITAL_COMMUNITY)
Admission: AD | Admit: 2023-05-15 | Discharge: 2023-05-16 | Disposition: A | Discharge status: Home / Self Care | Attending: Obstetrics and Gynecology | Admitting: Obstetrics and Gynecology

## 2023-05-15 ENCOUNTER — Other Ambulatory Visit: Payer: Self-pay

## 2023-05-15 ENCOUNTER — Encounter (HOSPITAL_COMMUNITY): Payer: Self-pay

## 2023-05-15 DIAGNOSIS — R102 Pelvic and perineal pain: Secondary | ICD-10-CM | POA: Insufficient documentation

## 2023-05-15 DIAGNOSIS — Z3493 Encounter for supervision of normal pregnancy, unspecified, third trimester: Secondary | ICD-10-CM

## 2023-05-15 DIAGNOSIS — N898 Other specified noninflammatory disorders of vagina: Secondary | ICD-10-CM

## 2023-05-15 DIAGNOSIS — O09523 Supervision of elderly multigravida, third trimester: Secondary | ICD-10-CM | POA: Diagnosis not present

## 2023-05-15 DIAGNOSIS — O26899 Other specified pregnancy related conditions, unspecified trimester: Secondary | ICD-10-CM

## 2023-05-15 DIAGNOSIS — Z3A34 34 weeks gestation of pregnancy: Secondary | ICD-10-CM | POA: Insufficient documentation

## 2023-05-15 DIAGNOSIS — O26893 Other specified pregnancy related conditions, third trimester: Secondary | ICD-10-CM | POA: Diagnosis not present

## 2023-05-15 LAB — URINALYSIS, ROUTINE W REFLEX MICROSCOPIC
Bilirubin Urine: NEGATIVE
Glucose, UA: NEGATIVE mg/dL
Hgb urine dipstick: NEGATIVE
Ketones, ur: NEGATIVE mg/dL
Leukocytes,Ua: NEGATIVE
Nitrite: NEGATIVE
Protein, ur: NEGATIVE mg/dL
Specific Gravity, Urine: 1.017 (ref 1.005–1.030)
pH: 6 (ref 5.0–8.0)

## 2023-05-15 NOTE — MAU Note (Signed)
.  Natalie Stewart is a 39 y.o. at [redacted]w[redacted]d here in MAU reporting: used the restroom - when she wiped she noticed blood streaked discharge. Afterwards started having pelvic pressure. Called nurse line and was told to come in for evaluation. Reports braxton hicks ctx, but nothing that has been regular. +FM. Denies LOF. Has not noticed anymore VB. Denies recent intercourse   Onset of complaint: 1800 Pain score: 3 - pelvic; 4 - ctx  Vitals:   05/15/23 2131  BP: 112/65  Pulse: 77  Resp: 16  Temp: 98.1 F (36.7 C)  SpO2: 100%     FHT: 182  Lab orders placed from triage: UA

## 2023-05-15 NOTE — MAU Provider Note (Signed)
 Chief Complaint:  Pelvic Pain   HPI   Event Date/Time   First Provider Initiated Contact with Patient 05/15/23 2322      Leafy Motsinger is a 38 y.o. G2P1001 at [redacted]w[redacted]d who presents to maternity admissions reporting that she used the BR today and noticed some scant VB and mucoid discharge after wiping. Denies any active VB, LOF, but reports she started to experience some mild irregular ctx's and pelvic pressure. Reports good FM's and no further VB. Denies any urinary frequency or dysuria. 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   Pregnancy Course: [facility]  Past Medical History:  Diagnosis Date   Anxiety    Gestational diabetes    No pertinent past medical history    OB History  Gravida Para Term Preterm AB Living  2 1 1   1   SAB IAB Ectopic Multiple Live Births     0 1    # Outcome Date GA Lbr Len/2nd Weight Sex Type Anes PTL Lv  2 Current           1 Term 05/21/21 [redacted]w[redacted]d 12:33 / 01:15 3580 g M Vag-Spont EPI  LIV     Birth Comments: WNL   Past Surgical History:  Procedure Laterality Date   ANTERIOR CRUCIATE LIGAMENT REPAIR Left 11/05/2021   Family History  Problem Relation Age of Onset   Hypertension Mother    Gallbladder disease Mother    Cancer Maternal Grandmother    Breast cancer Maternal Grandmother    Bladder Cancer Paternal Grandmother    Dementia Paternal Grandmother    Heart failure Paternal Grandfather    Atrial fibrillation Paternal Grandfather    Social History   Tobacco Use   Smoking status: Never   Smokeless tobacco: Never  Vaping Use   Vaping status: Never Used  Substance Use Topics   Alcohol use: Not Currently    Alcohol/week: 1.0 standard drink of alcohol    Types: 1 Standard drinks or equivalent per week    Comment: 1 per week   Drug use: Never   Allergies  Allergen Reactions   Latex Rash and  Other (See Comments)   No medications prior to admission.    I have reviewed patient's Past Medical Hx, Surgical Hx, Family Hx, Social Hx, medications and allergies.   ROS  Pertinent items noted in HPI and remainder of comprehensive ROS otherwise negative.   PHYSICAL EXAM  Patient Vitals for the past 24 hrs:  BP Temp Temp src Pulse Resp SpO2 Height Weight  05/16/23 0145 112/63 -- -- 72 16 -- -- --  05/15/23 2131 112/65 98.1 F (36.7 C) Oral 77 16 100 % 5\' 3"  (1.6 m) 74.7 kg    Constitutional: Well-developed, well-nourished female in no acute distress.  Cardiovascular: normal rate & rhythm, warm and well-perfused Respiratory: normal effort, no problems with respiration noted GI: Abd soft, non-tender, gravid MS: Extremities nontender, no edema, normal ROM Neurologic: Alert and oriented x 4.  GU: no CVA tenderness Pelvic: NEFG, physiologic discharge, no blood, cervix clean., swabs collected with fern With RN Chaperone     Fetal Tracing: Cat 1 , reactive @ 0030  Baseline: 130 Variability: moderate Accelerations: present Decelerations: absent Toco: occasional ctx , no longer appreciated by patient   Labs: Results for orders placed or performed during the hospital encounter of 05/15/23 (from the  past 24 hours)  Urinalysis, Routine w reflex microscopic -Urine, Clean Catch     Status: None   Collection Time: 05/15/23  9:58 PM  Result Value Ref Range   Color, Urine YELLOW YELLOW   APPearance CLEAR CLEAR   Specific Gravity, Urine 1.017 1.005 - 1.030   pH 6.0 5.0 - 8.0   Glucose, UA NEGATIVE NEGATIVE mg/dL   Hgb urine dipstick NEGATIVE NEGATIVE   Bilirubin Urine NEGATIVE NEGATIVE   Ketones, ur NEGATIVE NEGATIVE mg/dL   Protein, ur NEGATIVE NEGATIVE mg/dL   Nitrite NEGATIVE NEGATIVE   Leukocytes,Ua NEGATIVE NEGATIVE  Wet prep, genital     Status: Abnormal   Collection Time: 05/16/23 12:35 AM   Specimen: PATH Cytology Cervicovaginal Ancillary Only  Result Value Ref Range    Yeast Wet Prep HPF POC NONE SEEN NONE SEEN   Trich, Wet Prep NONE SEEN NONE SEEN   Clue Cells Wet Prep HPF POC NONE SEEN NONE SEEN   WBC, Wet Prep HPF POC >=10 (A) <10   Sperm NONE SEEN   Fern Test     Status: Normal   Collection Time: 05/16/23  1:31 AM  Result Value Ref Range   POCT Fern Test Negative = intact amniotic membranes     Imaging:  No results found.  MDM & MAU COURSE  MDM:  HIGH  No evidence of PTL/PROM  at this time  I have reviewed the patient chart and performed the physical exam . I have ordered & interpreted the lab results and reviewed and interpreted the NST Medications ordered as stated below.  A/P as described below.  Counseling and education provided and patient agreeable  with plan as described below. Verbalized understanding.    MAU Course: Orders Placed This Encounter  Procedures   Wet prep, genital   Urinalysis, Routine w reflex microscopic -Urine, Clean Catch   Fern Test   Discharge patient Discharge disposition: 01-Home or Self Care; Discharge patient date: 05/16/2023   No orders of the defined types were placed in this encounter.   ASSESSMENT   1. Pelvic pressure in pregnancy   2. [redacted] weeks gestation of pregnancy   3. Vaginal discharge during pregnancy in third trimester   4. Intact amniotic membranes during pregnancy in third trimester     PLAN  Discharge home in stable condition with return precautions.  F/U with OB See AVS for full description of verbal and written instructions provided to the patient Verbalized understanding and is agreeable to plan as described above      Allergies as of 05/16/2023       Reactions   Latex Rash, Other (See Comments)        Medication List     STOP taking these medications    amoxicillin-clavulanate 875-125 MG tablet Commonly known as: AUGMENTIN   methylPREDNISolone 4 MG Tbpk tablet Commonly known as: MEDROL DOSEPAK   oseltamivir 75 MG capsule Commonly known as: Tamiflu       TAKE  these medications    Doxylamine-Pyridoxine 10-10 MG Tbec Take 2 tablets by mouth daily.   prenatal multivitamin Tabs tablet Take 1 tablet by mouth daily at 12 noon.   sertraline 25 MG tablet Commonly known as: ZOLOFT Take 1 tablet (25 mg total) by mouth daily.        Marcell Barlow, MSN, [facility], [facility] [employer]

## 2023-05-15 NOTE — MAU Provider Note (Incomplete)
 Chief Complaint:  Pelvic Pain   HPI   Event Date/Time   First Provider Initiated Contact with Patient 05/15/23 2322      Natalie Stewart is a 38 y.o. G2P1001 at [redacted]w[redacted]d who presents to maternity admissions reporting ***.   Pregnancy Course: ***  Past Medical History:  Diagnosis Date  . Anxiety   . Gestational diabetes   . No pertinent past medical history    OB History  Gravida Para Term Preterm AB Living  2 1 1   1   SAB IAB Ectopic Multiple Live Births     0 1    # Outcome Date GA Lbr Len/2nd Weight Sex Type Anes PTL Lv  2 Current           1 Term 05/21/21 [redacted]w[redacted]d 12:33 / 01:15 3580 g M Vag-Spont EPI  LIV     Birth Comments: WNL   Past Surgical History:  Procedure Laterality Date  . ANTERIOR CRUCIATE LIGAMENT REPAIR Left 11/05/2021   Family History  Problem Relation Age of Onset  . Hypertension Mother   . Gallbladder disease Mother   . Cancer Maternal Grandmother   . Breast cancer Maternal Grandmother   . Bladder Cancer Paternal Grandmother   . Dementia Paternal Grandmother   . Heart failure Paternal Grandfather   . Atrial fibrillation Paternal Grandfather    Social History   Tobacco Use  . Smoking status: Never  . Smokeless tobacco: Never  Vaping Use  . Vaping status: Never Used  Substance Use Topics  . Alcohol use: Not Currently    Alcohol/week: 1.0 standard drink of alcohol    Types: 1 Standard drinks or equivalent per week    Comment: 1 per week  . Drug use: Never   Allergies  Allergen Reactions  . Latex Rash and Other (See Comments)   Medications Prior to Admission  Medication Sig Dispense Refill Last Dose/Taking  . Doxylamine-Pyridoxine 10-10 MG TBEC Take 2 tablets by mouth daily. 60 tablet 0 05/14/2023 Bedtime  . Prenatal Vit-Fe Fumarate-FA (PRENATAL MULTIVITAMIN) TABS tablet Take 1 tablet by mouth daily at 12 noon.   05/14/2023 Bedtime  . sertraline (ZOLOFT) 25 MG tablet Take 1 tablet (25 mg total) by mouth daily. 90 tablet 3 05/14/2023 Evening  .  amoxicillin-clavulanate (AUGMENTIN) 875-125 MG tablet Take 1 tablet by mouth 2 (two) times daily. 14 tablet 0   . methylPREDNISolone (MEDROL DOSEPAK) 4 MG TBPK tablet Take as directed. 21 tablet 0   . oseltamivir (TAMIFLU) 75 MG capsule Take 1 capsule (75 mg total) by mouth 2 (two) times daily. 10 capsule 0     I have reviewed patient's Past Medical Hx, Surgical Hx, Family Hx, Social Hx, medications and allergies.   ROS  Pertinent items noted in HPI and remainder of comprehensive ROS otherwise negative.   PHYSICAL EXAM  Patient Vitals for the past 24 hrs:  BP Temp Temp src Pulse Resp SpO2 Height Weight  05/15/23 2131 112/65 98.1 F (36.7 C) Oral 77 16 100 % 5\' 3"  (1.6 m) 74.7 kg    Constitutional: Well-developed, well-nourished female in no acute distress.  Cardiovascular: normal rate & rhythm, warm and well-perfused Respiratory: normal effort, no problems with respiration noted GI: Abd soft, non-tender, gravid MS: Extremities nontender, no edema, normal ROM Neurologic: Alert and oriented x 4.  GU: no CVA tenderness Pelvic: NEFG, physiologic discharge, no blood, cervix clean.      Fetal Tracing: Baseline: 140 Variability: Accelerations:  Decelerations: Toco:    Labs: Results  for orders placed or performed during the hospital encounter of 05/15/23 (from the past 24 hours)  Urinalysis, Routine w reflex microscopic -Urine, Clean Catch     Status: None   Collection Time: 05/15/23  9:58 PM  Result Value Ref Range   Color, Urine YELLOW YELLOW   APPearance CLEAR CLEAR   Specific Gravity, Urine 1.017 1.005 - 1.030   pH 6.0 5.0 - 8.0   Glucose, UA NEGATIVE NEGATIVE mg/dL   Hgb urine dipstick NEGATIVE NEGATIVE   Bilirubin Urine NEGATIVE NEGATIVE   Ketones, ur NEGATIVE NEGATIVE mg/dL   Protein, ur NEGATIVE NEGATIVE mg/dL   Nitrite NEGATIVE NEGATIVE   Leukocytes,Ua NEGATIVE NEGATIVE    Imaging:  No results found.  MDM & MAU COURSE  MDM:  MAU Course: Orders Placed This  Encounter  Procedures  . Wet prep, genital  . Urinalysis, Routine w reflex microscopic -Urine, Clean Catch  . Rupture of Membrane (ROM) Plus  . Fern Test   No orders of the defined types were placed in this encounter.   ASSESSMENT  No diagnosis found.  PLAN  Discharge home in stable condition with return precautions.  ***    Allergies as of 05/15/2023       Reactions   Latex Rash, Other (See Comments)     Med Rec must be completed prior to using this SMARTLINK***       Marcell Barlow, MSN, [facility], [facility] [employer]

## 2023-05-16 DIAGNOSIS — O26893 Other specified pregnancy related conditions, third trimester: Secondary | ICD-10-CM | POA: Diagnosis not present

## 2023-05-16 DIAGNOSIS — Z3A34 34 weeks gestation of pregnancy: Secondary | ICD-10-CM

## 2023-05-16 DIAGNOSIS — R102 Pelvic and perineal pain: Secondary | ICD-10-CM | POA: Diagnosis not present

## 2023-05-16 DIAGNOSIS — N898 Other specified noninflammatory disorders of vagina: Secondary | ICD-10-CM

## 2023-05-16 LAB — GC/CHLAMYDIA PROBE AMP (~~LOC~~) NOT AT ARMC
Chlamydia: NEGATIVE
Comment: NEGATIVE
Comment: NORMAL
Neisseria Gonorrhea: NEGATIVE

## 2023-05-16 LAB — POCT FERN TEST: POCT Fern Test: NEGATIVE

## 2023-05-16 LAB — WET PREP, GENITAL
Clue Cells Wet Prep HPF POC: NONE SEEN
Sperm: NONE SEEN
Trich, Wet Prep: NONE SEEN
WBC, Wet Prep HPF POC: >=10 — AB (ref <10)
Yeast Wet Prep HPF POC: NONE SEEN

## 2023-05-16 NOTE — Discharge Instructions (Signed)

## 2023-05-31 DIAGNOSIS — Z369 Encounter for antenatal screening, unspecified: Secondary | ICD-10-CM | POA: Diagnosis not present

## 2023-05-31 LAB — OB RESULTS CONSOLE GBS: GBS: POSITIVE

## 2023-06-05 ENCOUNTER — Other Ambulatory Visit (HOSPITAL_BASED_OUTPATIENT_CLINIC_OR_DEPARTMENT_OTHER): Payer: Self-pay

## 2023-06-05 ENCOUNTER — Other Ambulatory Visit (HOSPITAL_COMMUNITY): Payer: Self-pay

## 2023-06-05 DIAGNOSIS — R102 Pelvic and perineal pain: Secondary | ICD-10-CM | POA: Diagnosis not present

## 2023-06-05 MED ORDER — AMPICILLIN 500 MG PO CAPS
500.0000 mg | ORAL_CAPSULE | Freq: Four times a day (QID) | ORAL | 0 refills | Status: DC
  Filled 2023-06-05 (×2): qty 20, 5d supply, fill #0

## 2023-06-07 ENCOUNTER — Other Ambulatory Visit (HOSPITAL_COMMUNITY): Payer: Self-pay

## 2023-06-07 MED ORDER — AMPICILLIN 500 MG PO CAPS
500.0000 mg | ORAL_CAPSULE | Freq: Four times a day (QID) | ORAL | 0 refills | Status: DC
Start: 2023-06-07 — End: 2023-06-12
  Filled 2023-06-07 – 2023-06-09 (×2): qty 20, 5d supply, fill #0

## 2023-06-09 ENCOUNTER — Other Ambulatory Visit (HOSPITAL_COMMUNITY): Payer: Self-pay

## 2023-06-12 ENCOUNTER — Other Ambulatory Visit (HOSPITAL_COMMUNITY): Payer: Self-pay

## 2023-06-19 ENCOUNTER — Other Ambulatory Visit: Payer: Self-pay

## 2023-06-19 ENCOUNTER — Inpatient Hospital Stay (HOSPITAL_COMMUNITY): Admitting: Anesthesiology

## 2023-06-19 ENCOUNTER — Encounter (HOSPITAL_COMMUNITY): Payer: Self-pay | Admitting: Obstetrics and Gynecology

## 2023-06-19 ENCOUNTER — Inpatient Hospital Stay (HOSPITAL_COMMUNITY)
Admission: RE | Admit: 2023-06-19 | Discharge: 2023-06-20 | DRG: 807 | Disposition: A | Discharge status: Home / Self Care | Attending: Obstetrics and Gynecology | Admitting: Obstetrics and Gynecology

## 2023-06-19 DIAGNOSIS — Z3A39 39 weeks gestation of pregnancy: Secondary | ICD-10-CM | POA: Diagnosis not present

## 2023-06-19 DIAGNOSIS — Z8632 Personal history of gestational diabetes: Secondary | ICD-10-CM | POA: Diagnosis not present

## 2023-06-19 DIAGNOSIS — O9962 Diseases of the digestive system complicating childbirth: Secondary | ICD-10-CM | POA: Diagnosis present

## 2023-06-19 DIAGNOSIS — Z3490 Encounter for supervision of normal pregnancy, unspecified, unspecified trimester: Principal | ICD-10-CM | POA: Diagnosis present

## 2023-06-19 DIAGNOSIS — O99824 Streptococcus B carrier state complicating childbirth: Principal | ICD-10-CM | POA: Diagnosis present

## 2023-06-19 DIAGNOSIS — K219 Gastro-esophageal reflux disease without esophagitis: Secondary | ICD-10-CM | POA: Diagnosis present

## 2023-06-19 DIAGNOSIS — O3519X0 Maternal care for (suspected) chromosomal abnormality in fetus, other chromosomal abnormality, not applicable or unspecified: Secondary | ICD-10-CM | POA: Diagnosis not present

## 2023-06-19 DIAGNOSIS — O26893 Other specified pregnancy related conditions, third trimester: Secondary | ICD-10-CM | POA: Diagnosis present

## 2023-06-19 LAB — RPR: RPR Ser Ql: NONREACTIVE

## 2023-06-19 LAB — CBC
HCT: 32 % — ABNORMAL LOW (ref 36.0–46.0)
Hemoglobin: 10.6 g/dL — ABNORMAL LOW (ref 12.0–15.0)
MCH: 29.3 pg (ref 26.0–34.0)
MCHC: 33.1 g/dL (ref 30.0–36.0)
MCV: 88.4 fL (ref 80.0–100.0)
Platelets: 168 10*3/uL (ref 150–400)
RBC: 3.62 MIL/uL — ABNORMAL LOW (ref 3.87–5.11)
RDW: 14.7 % (ref 11.5–15.5)
WBC: 7.8 10*3/uL (ref 4.0–10.5)
nRBC: 0 % (ref 0.0–0.2)

## 2023-06-19 LAB — TYPE AND SCREEN
ABO/RH(D): O POS
Antibody Screen: NEGATIVE

## 2023-06-19 LAB — HIV ANTIBODY (ROUTINE TESTING W REFLEX): HIV Screen 4th Generation wRfx: NONREACTIVE

## 2023-06-19 MED ORDER — DIPHENHYDRAMINE HCL 50 MG/ML IJ SOLN: 12.5000 mg | INTRAMUSCULAR | Status: DC | PRN

## 2023-06-19 MED ORDER — DIPHENHYDRAMINE HCL 25 MG PO CAPS: 25.0000 mg | ORAL_CAPSULE | Freq: Four times a day (QID) | ORAL | Status: DC | PRN

## 2023-06-19 MED ORDER — OXYTOCIN-SODIUM CHLORIDE 30-0.9 UT/500ML-% IV SOLN
2.5000 [IU]/h | INTRAVENOUS | Status: DC
  Administered 2023-06-19: 2.5 [IU]/h via INTRAVENOUS

## 2023-06-19 MED ORDER — EPHEDRINE 5 MG/ML INJ: 10.0000 mg | INTRAVENOUS | Status: DC | PRN

## 2023-06-19 MED ORDER — ONDANSETRON HCL 4 MG/2ML IJ SOLN: 4.0000 mg | INTRAMUSCULAR | Status: DC | PRN

## 2023-06-19 MED ORDER — WITCH HAZEL-GLYCERIN EX PADS: 1.0000 | MEDICATED_PAD | CUTANEOUS | Status: DC | PRN

## 2023-06-19 MED ORDER — MEASLES, MUMPS & RUBELLA VAC IJ SOLR: 0.5000 mL | Freq: Once | INTRAMUSCULAR | Status: DC

## 2023-06-19 MED ORDER — COCONUT OIL OIL
1.0000 | TOPICAL_OIL | Status: DC | PRN
  Administered 2023-06-20: 1 via TOPICAL

## 2023-06-19 MED ORDER — SIMETHICONE 80 MG PO CHEW: 80.0000 mg | CHEWABLE_TABLET | ORAL | Status: DC | PRN

## 2023-06-19 MED ORDER — PHENYLEPHRINE 80 MCG/ML (10ML) SYRINGE FOR IV PUSH (FOR BLOOD PRESSURE SUPPORT): 80.0000 ug | PREFILLED_SYRINGE | INTRAVENOUS | Status: DC | PRN

## 2023-06-19 MED ORDER — MISOPROSTOL 50MCG HALF TABLET: 50.0000 ug | ORAL_TABLET | Freq: Once | ORAL | Status: DC

## 2023-06-19 MED ORDER — PRENATAL MULTIVITAMIN CH: 1.0000 | ORAL_TABLET | Freq: Every day | ORAL | Status: DC

## 2023-06-19 MED ORDER — TERBUTALINE SULFATE 1 MG/ML IJ SOLN: 0.2500 mg | Freq: Once | INTRAMUSCULAR | Status: DC | PRN

## 2023-06-19 MED ORDER — TRANEXAMIC ACID-NACL 1000-0.7 MG/100ML-% IV SOLN
INTRAVENOUS | Status: AC
  Filled 2023-06-19: qty 100

## 2023-06-19 MED ORDER — MEDROXYPROGESTERONE ACETATE 150 MG/ML IM SUSP: 150.0000 mg | INTRAMUSCULAR | Status: DC | PRN

## 2023-06-19 MED ORDER — OXYTOCIN BOLUS FROM INFUSION
333.0000 mL | Freq: Once | INTRAVENOUS | Status: AC
  Administered 2023-06-19: 333 mL via INTRAVENOUS

## 2023-06-19 MED ORDER — ZOLPIDEM TARTRATE 5 MG PO TABS: 5.0000 mg | ORAL_TABLET | Freq: Every evening | ORAL | Status: DC | PRN

## 2023-06-19 MED ORDER — LACTATED RINGERS IV SOLN
500.0000 mL | INTRAVENOUS | Status: DC | PRN
  Administered 2023-06-19: 500 mL via INTRAVENOUS

## 2023-06-19 MED ORDER — LIDOCAINE-EPINEPHRINE (PF) 2 %-1:200000 IJ SOLN
INTRAMUSCULAR | Status: DC | PRN
  Administered 2023-06-19: 5 mL via EPIDURAL

## 2023-06-19 MED ORDER — OXYCODONE-ACETAMINOPHEN 5-325 MG PO TABS: 1.0000 | ORAL_TABLET | ORAL | Status: DC | PRN

## 2023-06-19 MED ORDER — TRANEXAMIC ACID-NACL 1000-0.7 MG/100ML-% IV SOLN
1000.0000 mg | INTRAVENOUS | Status: AC
  Administered 2023-06-19: 1000 mg via INTRAVENOUS

## 2023-06-19 MED ORDER — DIBUCAINE (PERIANAL) 1 % EX OINT: 1.0000 | TOPICAL_OINTMENT | CUTANEOUS | Status: DC | PRN

## 2023-06-19 MED ORDER — OXYCODONE-ACETAMINOPHEN 5-325 MG PO TABS: 2.0000 | ORAL_TABLET | ORAL | Status: DC | PRN

## 2023-06-19 MED ORDER — TETANUS-DIPHTH-ACELL PERTUSSIS 5-2.5-18.5 LF-MCG/0.5 IM SUSY: 0.5000 mL | PREFILLED_SYRINGE | Freq: Once | INTRAMUSCULAR | Status: DC

## 2023-06-19 MED ORDER — OXYTOCIN-SODIUM CHLORIDE 30-0.9 UT/500ML-% IV SOLN
1.0000 m[IU]/min | INTRAVENOUS | Status: DC
  Administered 2023-06-19: 2 m[IU]/min via INTRAVENOUS
  Filled 2023-06-19: qty 500

## 2023-06-19 MED ORDER — ACETAMINOPHEN 325 MG PO TABS: 650.0000 mg | ORAL_TABLET | ORAL | Status: DC | PRN

## 2023-06-19 MED ORDER — BUPIVACAINE HCL (PF) 0.25 % IJ SOLN
INTRAMUSCULAR | Status: DC | PRN
  Administered 2023-06-19 (×2): 5 mL via EPIDURAL

## 2023-06-19 MED ORDER — ONDANSETRON HCL 4 MG/2ML IJ SOLN: 4.0000 mg | Freq: Four times a day (QID) | INTRAMUSCULAR | Status: DC | PRN

## 2023-06-19 MED ORDER — PHENYLEPHRINE 80 MCG/ML (10ML) SYRINGE FOR IV PUSH (FOR BLOOD PRESSURE SUPPORT)
80.0000 ug | PREFILLED_SYRINGE | INTRAVENOUS | Status: AC | PRN
  Administered 2023-06-19 (×2): 80 ug via INTRAVENOUS
  Filled 2023-06-19: qty 10

## 2023-06-19 MED ORDER — LIDOCAINE HCL (PF) 1 % IJ SOLN: 30.0000 mL | INTRAMUSCULAR | Status: DC | PRN

## 2023-06-19 MED ORDER — LACTATED RINGERS IV SOLN: INTRAVENOUS | Status: DC

## 2023-06-19 MED ORDER — SENNOSIDES-DOCUSATE SODIUM 8.6-50 MG PO TABS
2.0000 | ORAL_TABLET | Freq: Every day | ORAL | Status: DC
  Administered 2023-06-20: 2 via ORAL
  Filled 2023-06-19: qty 2

## 2023-06-19 MED ORDER — PENICILLIN G POT IN DEXTROSE 60000 UNIT/ML IV SOLN
3.0000 10*6.[IU] | INTRAVENOUS | Status: DC
  Administered 2023-06-19 (×2): 3 10*6.[IU] via INTRAVENOUS
  Filled 2023-06-19 (×2): qty 50

## 2023-06-19 MED ORDER — ONDANSETRON HCL 4 MG PO TABS: 4.0000 mg | ORAL_TABLET | ORAL | Status: DC | PRN

## 2023-06-19 MED ORDER — LACTATED RINGERS IV SOLN: 500.0000 mL | Freq: Once | INTRAVENOUS | Status: DC

## 2023-06-19 MED ORDER — FENTANYL-BUPIVACAINE-NACL 0.5-0.125-0.9 MG/250ML-% EP SOLN
12.0000 mL/h | EPIDURAL | Status: DC | PRN
  Administered 2023-06-19: 12 mL/h via EPIDURAL
  Filled 2023-06-19: qty 250

## 2023-06-19 MED ORDER — IBUPROFEN 600 MG PO TABS
600.0000 mg | ORAL_TABLET | Freq: Four times a day (QID) | ORAL | Status: DC
  Administered 2023-06-19 – 2023-06-20 (×4): 600 mg via ORAL
  Filled 2023-06-19 (×4): qty 1

## 2023-06-19 MED ORDER — SODIUM CHLORIDE 0.9 % IV SOLN
5.0000 10*6.[IU] | Freq: Once | INTRAVENOUS | Status: AC
  Administered 2023-06-19: 5 10*6.[IU] via INTRAVENOUS
  Filled 2023-06-19: qty 5

## 2023-06-19 MED ORDER — SOD CITRATE-CITRIC ACID 500-334 MG/5ML PO SOLN: 30.0000 mL | ORAL | Status: DC | PRN

## 2023-06-19 MED ORDER — SERTRALINE HCL 25 MG PO TABS
25.0000 mg | ORAL_TABLET | Freq: Every day | ORAL | Status: DC
  Filled 2023-06-19: qty 1

## 2023-06-19 MED ORDER — MISOPROSTOL 50MCG HALF TABLET: 50.0000 ug | ORAL_TABLET | ORAL | Status: DC

## 2023-06-19 MED ORDER — BENZOCAINE-MENTHOL 20-0.5 % EX AERO
1.0000 | INHALATION_SPRAY | CUTANEOUS | Status: DC | PRN
  Administered 2023-06-19: 1 via TOPICAL
  Filled 2023-06-19: qty 56

## 2023-06-19 NOTE — Anesthesia Procedure Notes (Signed)
 Epidural Patient location during procedure: OB Start time: 06/19/2023 7:44 AM End time: 06/19/2023 7:52 AM  Staffing Anesthesiologist: Leslye Rast, MD Performed: anesthesiologist   Preanesthetic Checklist Completed: patient identified, IV checked, site marked, risks and benefits discussed, surgical consent, monitors and equipment checked, pre-op evaluation and timeout performed  Epidural Patient position: sitting Prep: DuraPrep Patient monitoring: heart rate, continuous pulse ox and blood pressure Approach: midline Location: L4-L5 Injection technique: LOR saline  Needle:  Needle type: Tuohy  Needle gauge: 17 G Needle length: 9 cm Needle insertion depth: 5 cm Catheter type: closed end flexible Catheter size: 19 Gauge Catheter at skin depth: 10 cm Test dose: negative and 1.5% lidocaine with Epi 1:200 K  Assessment Events: blood not aspirated, no cerebrospinal fluid, injection not painful, no injection resistance, no paresthesia and negative IV test  Additional Notes Reason for block:procedure for pain

## 2023-06-19 NOTE — Lactation Note (Signed)
 This note was copied from a baby's chart. Lactation Consultation Note  Patient Name: Girl Laelah Siravo [id number] Date: 06/19/2023 Age:37 hours Reason for consult: Initial assessment;Term Experienced BF mom stated this baby has been wanting to BF every 1 to 1 1/2 hrs. Mom stated she is hearing swallows. Explained mom's that hasn't been long since stopped BF has more colostrum and babies tend to want to eat more after delivery verses the 1st baby it may have taken 24 hrs for that child to start feeding well. Mom agreed. Newborn feeding habits, STS, I&O, supply and demand reviewed. Mom encouraged to feed baby 8-12 times/24 hours and with feeding cues.  Asked mom if she would call when she latched the baby so LC can see a latch. Mom stated yes and can give mom a refresher on BF a newborn. Encouraged mom to call for assistance when needed. Looking forward to see mom at next feeding.  Maternal Data Does the patient have breastfeeding experience prior to this delivery?: Yes How long did the patient breastfeed?: mom has a 54 yr old that she just stopped BF in May 2024. BF that baby 1 yr.  Feeding    LATCH Score       Type of Nipple: Everted at rest and after stimulation            Lactation Tools Discussed/Used    Interventions Interventions: Breast feeding basics reviewed;LC Services brochure;Education  Discharge    Consult Status Consult Status: Follow-up Date: 06/20/23 Follow-up type: In-patient    Jerimiah Wolman G 06/19/2023, 10:04 PM

## 2023-06-19 NOTE — Anesthesia Preprocedure Evaluation (Signed)
 Anesthesia Evaluation  Patient identified by MRN, date of birth, ID band Patient awake    Reviewed: Allergy & Precautions, NPO status , Patient's Chart, lab work & pertinent test results, reviewed documented beta blocker date and time   History of Anesthesia Complications Negative for: history of anesthetic complications  Airway Mallampati: II  TM Distance: >3 FB     Dental no notable dental hx.    Pulmonary shortness of breath, neg sleep apnea, neg COPD, neg PE   breath sounds clear to auscultation       Cardiovascular (-) hypertension(-) CAD, (-) Past MI, (-) Cardiac Stents and (-) CHF  Rhythm:Regular Rate:Normal     Neuro/Psych neg Seizures  Anxiety        GI/Hepatic ,GERD  ,,(+) neg Cirrhosis        Endo/Other  diabetes, Gestational    Renal/GU Renal disease     Musculoskeletal   Abdominal   Peds  Hematology  (+) Blood dyscrasia, anemia   Anesthesia Other Findings   Reproductive/Obstetrics (+) Pregnancy                              Anesthesia Physical Anesthesia Plan  ASA: 2  Anesthesia Plan: Epidural   Post-op Pain Management:    Induction:   PONV Risk Score and Plan: 1 and Ondansetron  Airway Management Planned:   Additional Equipment:   Intra-op Plan:   Post-operative Plan:   Informed Consent: I have reviewed the patients History and Physical, chart, labs and discussed the procedure including the risks, benefits and alternatives for the proposed anesthesia with the patient or authorized representative who has indicated his/her understanding and acceptance.       Plan Discussed with:   Anesthesia Plan Comments:          Anesthesia Quick Evaluation

## 2023-06-19 NOTE — H&P (Signed)
 Natalie Stewart is a 38 y.o. female presenting for eIOL.  Cytotec last HS now with epidural.  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  GSB neg. OB History     Gravida  2   Para  1   Term  1   Preterm      AB      Living  1      SAB      IAB      Ectopic      Multiple  0   Live Births  1          Past Medical History:  Diagnosis Date   Anxiety    Gestational diabetes    No pertinent past medical history    Past Surgical History:  Procedure Laterality Date   ANTERIOR CRUCIATE LIGAMENT REPAIR Left 11/05/2021   Family History: family history includes Atrial fibrillation in her paternal grandfather; Bladder Cancer in her paternal grandmother; Breast cancer in her maternal grandmother; Cancer in her maternal grandmother; Dementia in her paternal grandmother; Gallbladder disease in her mother; Heart failure in her paternal grandfather; Hypertension in her mother. Social History:  reports that she has never smoked. She has never used smokeless tobacco. She reports that she does not currently use alcohol after a past usage of about 1.0 standard drink of alcohol per week. She reports that she does not use drugs.     Maternal Diabetes: No Genetic Screening: Abnormal:  Results: Other:fetal sex chromosome mosaicism Maternal Ultrasounds/Referrals: Normal Fetal Ultrasounds or other Referrals:  Other:  Maternal Substance Abuse:  No Significant Maternal Medications:  None Significant Maternal Lab Results:  Group B Strep positive Number of Prenatal Visits:greater than 3 verified prenatal visits Maternal Vaccinations:TDap Other Comments:  None  Review of Systems History Dilation: 4.5 Effacement (%): 80 Station: -3 Exam by:: Asencion Blacksmith MD Blood pressure (!) 91/43, pulse 64, temperature 98 F (36.7 C), resp. rate 16, SpO2 99%, unknown if currently breastfeeding. Exam Physical Exam  Vitals and nursing note reviewed. Exam conducted with a chaperone present.   Constitutional:      Appearance: Normal appearance.  HENT:     Head: Normocephalic.  Eyes:     Pupils: Pupils are equal, round, and reactive to light.  Cardiovascular:     Rate and Rhythm: Normal rate and regular rhythm.     Pulses: Normal pulses.  Abdominal:     General: Abdomen is Gravid, nontender Neurological:     Mental Status: She is alert. Pt wishes Full resuscitation in the event of a code. 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

## 2023-06-20 ENCOUNTER — Other Ambulatory Visit (HOSPITAL_COMMUNITY): Payer: Self-pay

## 2023-06-20 LAB — CBC
HCT: 24.9 % — ABNORMAL LOW (ref 36.0–46.0)
Hemoglobin: 8.4 g/dL — ABNORMAL LOW (ref 12.0–15.0)
MCH: 29.8 pg (ref 26.0–34.0)
MCHC: 33.7 g/dL (ref 30.0–36.0)
MCV: 88.3 fL (ref 80.0–100.0)
Platelets: 133 10*3/uL — ABNORMAL LOW (ref 150–400)
RBC: 2.82 MIL/uL — ABNORMAL LOW (ref 3.87–5.11)
RDW: 14.8 % (ref 11.5–15.5)
WBC: 10.6 10*3/uL — ABNORMAL HIGH (ref 4.0–10.5)
nRBC: 0 % (ref 0.0–0.2)

## 2023-06-20 MED ORDER — ACETAMINOPHEN 325 MG PO TABS
650.0000 mg | ORAL_TABLET | ORAL | 1 refills | Status: DC | PRN
  Filled 2023-06-20: qty 30, 3d supply, fill #0

## 2023-06-20 MED ORDER — IBUPROFEN 600 MG PO TABS
600.0000 mg | ORAL_TABLET | Freq: Four times a day (QID) | ORAL | 0 refills | Status: DC
  Filled 2023-06-20: qty 30, 8d supply, fill #0

## 2023-06-20 MED ORDER — FERROUS SULFATE 325 (65 FE) MG PO TABS
325.0000 mg | ORAL_TABLET | Freq: Every day | ORAL | 3 refills | Status: DC
  Filled 2023-06-20: qty 30, 30d supply, fill #0
  Filled 2023-07-15: qty 30, 30d supply, fill #1

## 2023-06-20 MED ORDER — FERROUS SULFATE 325 (65 FE) MG PO TABS
325.0000 mg | ORAL_TABLET | Freq: Every day | ORAL | Status: DC
  Administered 2023-06-20: 325 mg via ORAL
  Filled 2023-06-20: qty 1

## 2023-06-20 NOTE — Anesthesia Postprocedure Evaluation (Signed)
 Anesthesia Post Note  Patient: Kearie Mennen  Procedure(s) Performed: AN AD HOC LABOR EPIDURAL     Patient location during evaluation: Mother Baby Anesthesia Type: Epidural Level of consciousness: awake, oriented and awake and alert Pain management: pain level controlled Vital Signs Assessment: post-procedure vital signs reviewed and stable Respiratory status: spontaneous breathing, nonlabored ventilation and respiratory function stable Cardiovascular status: stable Postop Assessment: no headache, patient able to bend at knees, adequate PO intake, able to ambulate, no apparent nausea or vomiting and no backache Anesthetic complications: no   No notable events documented.  Last Vitals:  Vitals:   06/20/23 0128 06/20/23 0549  BP: 104/67 113/74  Pulse: 79 81  Resp: 18 18  Temp: 36.7 C 36.7 C  SpO2: 99% 99%    Last Pain:  Vitals:   06/20/23 0842  TempSrc:   PainSc: 3    Pain Goal: Patients Stated Pain Goal: 2 (06/19/23 0730)                 Nishita Isaacks

## 2023-06-20 NOTE — Lactation Note (Signed)
 This note was copied from a baby's chart. Lactation Consultation Note  Patient Name: Natalie Stewart [id number] Date: 06/20/2023 Age:37 hours  Reason for consult: Follow-up assessment;Term;Mother's request  P2, [redacted]w[redacted]d, 3% weight loss  Follow up LC visit to see P2 mother and baby. Mother has breastfeeding experience and states baby is breastfeeding better. She reports baby cluster fed last night and her nipples are a little tender from frequent feedings. Mother was given coconut oil for nipple tenderness. She denied nipple damage.  Mother is planning to follow up with the Lactation Consultant at [facility]. She saw Natalie Brass, RN, IBCLC, several times and it was helpful with breastfeeding success.    Discharge is anticipated for mother and baby pending baby's status (heart screen and bili) after 24 hrs.  Mom made aware of O/P services, breastfeeding support groups, community resources, and our phone # for post-discharge questions.     Feeding Mother's Current Feeding Choice: Breast Milk   Lactation Tools Discussed/Used Tools: Coconut oil  Interventions Interventions: Breast feeding basics reviewed;Coconut oil;Education  Discharge Discharge Education: Warning signs for feeding baby;Engorgement and breast care Pump: Personal  Consult Status Consult Status: Complete Date: 06/20/23    Natalie Stewart 06/20/2023, 10:28 AM

## 2023-06-20 NOTE — Discharge Summary (Signed)
 Postpartum Discharge Summary  Date of Service updated 06/20/2023     Patient Name: Natalie Stewart DOB: 19-Sep-1985 MRN: [id number]  Date of admission: 06/19/2023 Delivery date:06/19/2023 Delivering provider: Candice Camp Date of discharge: 06/20/2023  Admitting diagnosis: Encounter for elective induction of labor [Z34.90] Intrauterine pregnancy: [redacted]w[redacted]d     Secondary diagnosis:  Principal Problem:   Encounter for elective induction of labor  Additional problems: Acute blood loss anemia, clinically insignificant    Discharge diagnosis: Term Pregnancy Delivered and Anemia                                              Post partum procedures: none Augmentation: AROM and Pitocin Complications: None  Hospital course: Induction of Labor With Vaginal Delivery   38 y.o. yo G2P2002 at [redacted]w[redacted]d was admitted to the hospital 06/19/2023 for induction of labor.  Indication for induction: Elective.  Patient had an labor course complicated by none. Membrane Rupture Time/Date: 8:34 AM,06/19/2023  Delivery Method:Vaginal, Spontaneous Operative Delivery:N/A Episiotomy: None Lacerations:  2nd degree;Perineal Details of delivery can be found in separate delivery note.  Patient had a postpartum course complicated by none. Started on oral iron for acute blood loss anemia. Patient is discharged home 06/20/23.  Newborn Data: Birth date:06/19/2023 Birth time:2:21 PM Gender:Female Living status:Living Apgars:8 ,9  Weight:3790 g  Magnesium Sulfate received: No BMZ received: No Rhophylac:N/A MMR:N/A T-DaP:Given prenatally Transfusion:No Immunizations administered: Immunization History  Administered Date(s) Administered   Influenza,inj,Quad PF,6+ Mos 12/30/2021   Influenza,inj,quad, With Preservative 12/31/2020   Influenza-Unspecified 01/28/2019, 01/03/2020, 01/05/2023   PFIZER(Purple Top)SARS-COV-2 Vaccination 02/27/2019, 03/20/2019   Tdap 10/15/2015, 03/18/2021    Physical exam  Vitals:   06/19/23  2007 06/19/23 2131 06/20/23 0128 06/20/23 0549  BP: 122/70 103/65 104/67 113/74  Pulse: 84 87 79 81  Resp: 18 18 18 18   Temp:  98.1 F (36.7 C) 98.1 F (36.7 C) 98.1 F (36.7 C)  TempSrc:  Oral Oral Oral  SpO2: 98% 96% 99% 99%   General: alert, cooperative, and no distress Lochia: appropriate Uterine Fundus: firm Incision: N/A DVT Evaluation: No evidence of DVT seen on physical exam. Labs: Lab Results  Component Value Date   WBC 10.6 (H) 06/20/2023   HGB 8.4 (L) 06/20/2023   HCT 24.9 (L) 06/20/2023   MCV 88.3 06/20/2023   PLT 133 (L) 06/20/2023      Latest Ref Rng & Units 12/31/2021    8:05 AM  CMP  Glucose 70 - 99 mg/dL 93   BUN 6 - 20 mg/dL 23   Creatinine 7.82 - 1.00 mg/dL 9.56   Sodium 213 - 086 mmol/L 137   Potassium 3.5 - 5.2 mmol/L 4.2   Chloride 96 - 106 mmol/L 102   CO2 20 - 29 mmol/L 23   Calcium 8.7 - 10.2 mg/dL 9.2   Total Protein 6.0 - 8.5 g/dL 6.5   Total Bilirubin 0.0 - 1.2 mg/dL 0.6   Alkaline Phos 44 - 121 IU/L 102   AST 0 - 40 IU/L 16   ALT 0 - 32 IU/L 14    Edinburgh Score:    06/19/2023    5:33 PM  Edinburgh Postnatal Depression Scale Screening Tool  I have been able to laugh and see the funny side of things. 0  I have looked forward with enjoyment to things. 0  I have blamed myself  unnecessarily when things went wrong. 1  I have been anxious or worried for no good reason. 1  I have felt scared or panicky for no good reason. 0  Things have been getting on top of me. 1  I have been so unhappy that I have had difficulty sleeping. 0  I have felt sad or miserable. 0  I have been so unhappy that I have been crying. 1  The thought of harming myself has occurred to me. 0  Edinburgh Postnatal Depression Scale Total 4      After visit meds:  Allergies as of 06/20/2023       Reactions   Latex Rash, Other (See Comments)        Medication List     TAKE these medications    acetaminophen 325 MG tablet Commonly known as: Tylenol Take  2 tablets (650 mg total) by mouth every 4 (four) hours as needed (for pain scale < 4).   doxylamine (Sleep) 25 MG tablet Commonly known as: UNISOM Take 25 mg by mouth at bedtime as needed for sleep.   ferrous sulfate 325 (65 FE) MG tablet Take 1 tablet (325 mg total) by mouth daily with breakfast.   ibuprofen 600 MG tablet Commonly known as: ADVIL Take 1 tablet (600 mg total) by mouth every 6 (six) hours.   pantoprazole 20 MG tablet Commonly known as: PROTONIX Take 20 mg by mouth daily.   prenatal multivitamin Tabs tablet Take 1 tablet by mouth daily at 12 noon.   sertraline 25 MG tablet Commonly known as: ZOLOFT Take 1 tablet (25 mg total) by mouth daily.         Discharge home in stable condition Infant Feeding: Bottle and Breast Infant Disposition:home with mother Discharge instruction: per After Visit Summary and Postpartum booklet. Activity: Advance as tolerated. Pelvic rest for 6 weeks.  Diet: routine diet Anticipated Birth Control: Unsure Postpartum Appointment:6 weeks Additional Postpartum F/U:  none Future Appointments: Future Appointments  Date Time Provider Department Center  03/19/2024  3:40 PM Cherre Cornish, NP [facility] None    06/20/2023 Grace Laura, MD

## 2023-06-20 NOTE — Progress Notes (Signed)
 Postpartum Progress Note  S: No complaints. Feeling well. Lochia appropriate. No subjective fevers/chills. No lightheadedness, dizziness.  O:     06/20/2023    5:49 AM 06/20/2023    1:28 AM 06/19/2023    9:31 PM  Vitals with BMI  Systolic 113 104 161  Diastolic 74 67 65  Pulse 81 79 87    Gen: NAD, A&O Pulm: NWOB Abd: soft, appropriately ttp, fundus firm and below Umb Ext: No evidence of DVT, trace edema b/l  Labs Recent Results (from the past 2160 hours)  POCT Influenza A/B     Status: Abnormal   Collection Time: 03/29/23  1:44 PM  Result Value Ref Range   Influenza A, POC Positive (A) Negative   Influenza B, POC Negative Negative  POC COVID-19     Status: Normal   Collection Time: 03/29/23  1:44 PM  Result Value Ref Range   SARS Coronavirus 2 Ag Negative Negative  Urinalysis, Routine w reflex microscopic -Urine, Clean Catch     Status: None   Collection Time: 05/15/23  9:58 PM  Result Value Ref Range   Color, Urine YELLOW YELLOW   APPearance CLEAR CLEAR   Specific Gravity, Urine 1.017 1.005 - 1.030   pH 6.0 5.0 - 8.0   Glucose, UA NEGATIVE NEGATIVE mg/dL   Hgb urine dipstick NEGATIVE NEGATIVE   Bilirubin Urine NEGATIVE NEGATIVE   Ketones, ur NEGATIVE NEGATIVE mg/dL   Protein, ur NEGATIVE NEGATIVE mg/dL   Nitrite NEGATIVE NEGATIVE   Leukocytes,Ua NEGATIVE NEGATIVE    Comment: Performed at [facility], [address], [place], [place] [address]  GC/Chlamydia probe amp ([place])not at [facility]     Status: None   Collection Time: 05/15/23 11:33 PM  Result Value Ref Range   Neisseria Gonorrhea Negative    Chlamydia Negative    Comment Normal Reference Ranger Chlamydia - Negative    Comment      Normal Reference Range Neisseria Gonorrhea - Negative  Wet prep, genital     Status: Abnormal   Collection Time: 05/16/23 12:35 AM   Specimen: PATH Cytology Cervicovaginal Ancillary Only  Result Value Ref Range   Yeast Wet Prep HPF POC NONE SEEN NONE SEEN   Trich,  Wet Prep NONE SEEN NONE SEEN   Clue Cells Wet Prep HPF POC NONE SEEN NONE SEEN   WBC, Wet Prep HPF POC >=10 (A) <10   Sperm NONE SEEN     Comment: Performed at [facility], [address] [place], [place] [address]  Crist Fat Test     Status: Normal   Collection Time: 05/16/23  1:31 AM  Result Value Ref Range   POCT Fern Test Negative = intact amniotic membranes   OB RESULT CONSOLE Group B Strep     Status: None   Collection Time: 05/31/23 12:00 AM  Result Value Ref Range   GBS Positive   Type and screen [facility]     Status: None   Collection Time: 06/19/23 12:49 AM  Result Value Ref Range   ABO/RH(D) O POS    Antibody Screen NEG    Sample Expiration      06/22/2023,2359 Performed at [facility], [address] [place], [place] [address]   HIV Antibody (routine testing w rflx)     Status: None   Collection Time: 06/19/23  1:00 AM  Result Value Ref Range   HIV Screen 4th Generation wRfx Non Reactive Non Reactive  Comment: Performed at [facility], [address] [place], [place] [address]  CBC     Status: Abnormal   Collection Time: 06/19/23  1:00 AM  Result Value Ref Range   WBC 7.8 4.0 - 10.5 K/uL   RBC 3.62 (L) 3.87 - 5.11 MIL/uL   Hemoglobin 10.6 (L) 12.0 - 15.0 g/dL   HCT 60.4 (L) 54.0 - 98.1 %   MCV 88.4 80.0 - 100.0 fL   MCH 29.3 26.0 - 34.0 pg   MCHC 33.1 30.0 - 36.0 g/dL   RDW 19.1 47.8 - 29.5 %   Platelets 168 150 - 400 K/uL   nRBC 0.0 0.0 - 0.2 %    Comment: Performed at [facility], [address], [place], [place] [address]  RPR     Status: None   Collection Time: 06/19/23  1:00 AM  Result Value Ref Range   RPR Ser Ql NON REACTIVE NON REACTIVE    Comment: Performed at [facility], [address] [place], [place] [address]  CBC     Status: Abnormal   Collection Time: 06/20/23  4:51 AM  Result Value Ref Range   WBC 10.6 (H) 4.0 - 10.5 K/uL   RBC 2.82 (L) 3.87 - 5.11 MIL/uL   Hemoglobin 8.4 (L) 12.0 -  15.0 g/dL   HCT 46.9 (L) 62.9 - 52.8 %   MCV 88.3 80.0 - 100.0 fL   MCH 29.8 26.0 - 34.0 pg   MCHC 33.7 30.0 - 36.0 g/dL   RDW 41.3 24.4 - 01.0 %   Platelets 133 (L) 150 - 400 K/uL   nRBC 0.0 0.0 - 0.2 %    Comment: Performed at [facility], [address], [place], [place] [address]    A/P:  PPD1 s/p SVD, doing well pp. AFVSS. Benign exam.  Acute blood loss anemia, not clinically significant - start oral iron. No symptoms. Plan discharge today.  Cathryn Cobb, MD

## 2023-06-20 NOTE — Social Work (Signed)
 MOB was referred for history of depression/anxiety.  * Referral screened out by Clinical Social Worker because none of the following criteria appear to apply:  ~ History of anxiety/depression during this pregnancy, or of post-partum depression following prior delivery.  ~ Diagnosis of anxiety and/or depression within last 3 years OR * MOB's symptoms currently being treated with medication and/or therapy. Per chart review MOB is currently prescribed Zoloft 25mg . Edinburgh=0   Please contact the Clinical Social Worker if needs arise, or by MOB request.   Haroldine Likens, LCSWA Clinical Social Worker [phone]

## 2023-06-24 ENCOUNTER — Other Ambulatory Visit (HOSPITAL_BASED_OUTPATIENT_CLINIC_OR_DEPARTMENT_OTHER): Payer: Self-pay

## 2023-06-26 ENCOUNTER — Other Ambulatory Visit (HOSPITAL_BASED_OUTPATIENT_CLINIC_OR_DEPARTMENT_OTHER): Payer: Self-pay

## 2023-06-27 ENCOUNTER — Telehealth (HOSPITAL_COMMUNITY): Payer: Self-pay

## 2023-06-27 NOTE — Telephone Encounter (Signed)
 06/27/2023   Name: Natalie Stewart MRN: [id number] DOB: 1985-09-19  Reason for Call:  Transition of Care Hospital Discharge Call  Contact Status: Patient Contact Status: Message  Language assistant needed:          Follow-Up Questions:    Dimple Francis Postnatal Depression Scale:  In the Past 7 Days:    PHQ2-9 Depression Scale:     Discharge Follow-up:    Post-discharge interventions: NA  Signature  Wadell Guild

## 2023-07-03 ENCOUNTER — Inpatient Hospital Stay (HOSPITAL_COMMUNITY)

## 2023-07-17 ENCOUNTER — Other Ambulatory Visit (HOSPITAL_COMMUNITY): Payer: Self-pay

## 2023-08-02 DIAGNOSIS — N760 Acute vaginitis: Secondary | ICD-10-CM | POA: Diagnosis not present

## 2023-08-02 DIAGNOSIS — Z1389 Encounter for screening for other disorder: Secondary | ICD-10-CM | POA: Diagnosis not present

## 2023-09-25 ENCOUNTER — Encounter: Payer: Self-pay | Admitting: Medical-Surgical

## 2023-09-25 ENCOUNTER — Other Ambulatory Visit (HOSPITAL_COMMUNITY): Payer: Self-pay

## 2023-09-25 ENCOUNTER — Other Ambulatory Visit: Payer: Self-pay

## 2023-12-13 DIAGNOSIS — H5213 Myopia, bilateral: Secondary | ICD-10-CM | POA: Diagnosis not present

## 2024-01-03 ENCOUNTER — Other Ambulatory Visit (HOSPITAL_COMMUNITY): Payer: Self-pay

## 2024-01-03 MED ORDER — FLUZONE 0.5 ML IM SUSY
0.5000 mL | PREFILLED_SYRINGE | Freq: Once | INTRAMUSCULAR | 0 refills | Status: AC
  Filled 2024-01-03: qty 0.5, 1d supply, fill #0

## 2024-03-19 ENCOUNTER — Other Ambulatory Visit (HOSPITAL_COMMUNITY): Payer: Self-pay

## 2024-03-19 ENCOUNTER — Encounter: Payer: Self-pay | Admitting: Medical-Surgical

## 2024-03-19 ENCOUNTER — Encounter (HOSPITAL_COMMUNITY): Payer: Self-pay

## 2024-03-19 ENCOUNTER — Ambulatory Visit: Payer: Commercial Managed Care - PPO | Admitting: Medical-Surgical

## 2024-03-19 VITALS — BP 115/71 | HR 58 | Temp 98.7°F | Resp 20 | Ht 63.0 in | Wt 137.0 lb

## 2024-03-19 DIAGNOSIS — F419 Anxiety disorder, unspecified: Secondary | ICD-10-CM

## 2024-03-19 DIAGNOSIS — J0100 Acute maxillary sinusitis, unspecified: Secondary | ICD-10-CM | POA: Diagnosis not present

## 2024-03-19 MED ORDER — SERTRALINE HCL 50 MG PO TABS
50.0000 mg | ORAL_TABLET | Freq: Every day | ORAL | 3 refills | Status: AC
  Filled 2024-03-19: qty 90, 90d supply, fill #0

## 2024-03-19 MED ORDER — CEFDINIR 300 MG PO CAPS
300.0000 mg | ORAL_CAPSULE | Freq: Two times a day (BID) | ORAL | 0 refills | Status: AC
  Filled 2024-03-19: qty 14, 7d supply, fill #0

## 2024-03-19 NOTE — Progress Notes (Unsigned)
 "       Established patient visit   History of Present Illness   Discussed the use of AI scribe software for clinical note transcription with the patient, who gave verbal consent to proceed.  History of Present Illness   Natalie Stewart is a 39 year old female who presents for a follow-up on her mood and medication management.  Mood symptoms and medication management - Sertraline  25 mg daily prevents panic attacks but still has some b. - Baseline anxiety persists, primarily related to caring for her eight-month-old and three-year-old children. - No thoughts of self-harm or harm to others.  Respiratory and otologic symptoms - Respiratory symptoms present for approximately two and a half weeks. - Initial clear nasal congestion progressed to yellow nasal discharge. - Scratchy throat and sharp right ear pain, with ear pain disrupting sleep. - Ibuprofen  400 mg twice daily used for throat and ear pain. - No other treatments utilized.    Physical Exam   Physical Exam Vitals reviewed.  Constitutional:      General: She is not in acute distress.    Appearance: Normal appearance. She is not ill-appearing.  HENT:     Head: Normocephalic and atraumatic.     Right Ear: Ear canal and external ear normal. A middle ear effusion is present. There is no impacted cerumen.     Left Ear: Ear canal and external ear normal. A middle ear effusion is present. There is no impacted cerumen.     Nose: Congestion present.     Right Sinus: Maxillary sinus tenderness present.     Left Sinus: Maxillary sinus tenderness present.     Mouth/Throat:     Mouth: Mucous membranes are moist.     Pharynx: No posterior oropharyngeal erythema.  Eyes:     General: No scleral icterus.       Right eye: No discharge.        Left eye: No discharge.     Extraocular Movements: Extraocular movements intact.     Conjunctiva/sclera: Conjunctivae normal.     Pupils: Pupils are equal, round, and reactive to light.   Cardiovascular:     Rate and Rhythm: Normal rate and regular rhythm.     Pulses: Normal pulses.     Heart sounds: Normal heart sounds. No murmur heard.    No friction rub. No gallop.  Pulmonary:     Effort: Pulmonary effort is normal. No respiratory distress.     Breath sounds: Normal breath sounds. No wheezing.  Lymphadenopathy:     Cervical: No cervical adenopathy.  Skin:    General: Skin is warm and dry.  Neurological:     Mental Status: She is alert and oriented to person, place, and time.  Psychiatric:        Mood and Affect: Mood normal.        Behavior: Behavior normal.        Thought Content: Thought content normal.        Judgment: Judgment normal.    Assessment & Plan   Acute maxillary sinusitis Maxillary sinus tenderness and ear pain due to congestion and Eustachian tube dysfunction. No recent antibiotic use or history of yeast infections with antibiotics. - Prescribed Omnicef  300 mg twice daily for 7 days. - Advised Tylenol  or ibuprofen  for pain as needed.  Generalized anxiety disorder Increased anxiety due to life stressors. Considering sertraline  dose adjustment to manage symptoms. - Increase sertraline  to 50 mg daily. - Advised to monitor response and report  in 3-4 weeks. - Instructed to contact if adverse effects occur or if reverting to 25 mg is necessary.     Follow up   Return in about 6 months (around 09/16/2024) for annual physical exam or sooner if needed. __________________________________ Zada FREDRIK Palin, DNP, APRN, FNP-BC Primary Care and Sports Medicine [facility] [place] "

## 2024-03-21 ENCOUNTER — Other Ambulatory Visit (HOSPITAL_COMMUNITY): Payer: Self-pay

## 2024-03-23 ENCOUNTER — Encounter: Payer: Self-pay | Admitting: Medical-Surgical

## 2024-03-28 ENCOUNTER — Other Ambulatory Visit (HOSPITAL_COMMUNITY): Payer: Self-pay

## 2024-03-28 MED ORDER — NITROGLYCERIN 0.2 MG/HR TD PT24
MEDICATED_PATCH | TRANSDERMAL | 1 refills | Status: AC
  Filled 2024-03-28: qty 30, 90d supply, fill #0

## 2024-09-18 ENCOUNTER — Encounter: Admitting: Medical-Surgical
